# Patient Record
Sex: Male | Born: 1970 | Race: White | Hispanic: No | Smoking: Former smoker
Health system: Southern US, Community
[De-identification: ages and names within clinical notes are randomized; demographics above are authoritative.]

## PROBLEM LIST (undated history)

## (undated) DIAGNOSIS — I10 Essential (primary) hypertension: Secondary | ICD-10-CM

## (undated) DIAGNOSIS — Z87442 Personal history of urinary calculi: Secondary | ICD-10-CM

## (undated) DIAGNOSIS — I209 Angina pectoris, unspecified: Secondary | ICD-10-CM

## (undated) DIAGNOSIS — M1990 Unspecified osteoarthritis, unspecified site: Secondary | ICD-10-CM

---

## 1990-05-05 HISTORY — PX: BACK SURGERY: SHX140

## 2015-10-18 DIAGNOSIS — R000 Tachycardia, unspecified: Secondary | ICD-10-CM | POA: Diagnosis not present

## 2015-10-18 DIAGNOSIS — R42 Dizziness and giddiness: Secondary | ICD-10-CM | POA: Diagnosis not present

## 2015-10-18 DIAGNOSIS — R404 Transient alteration of awareness: Secondary | ICD-10-CM | POA: Diagnosis not present

## 2015-10-18 DIAGNOSIS — Z87891 Personal history of nicotine dependence: Secondary | ICD-10-CM | POA: Diagnosis not present

## 2015-10-18 DIAGNOSIS — R002 Palpitations: Secondary | ICD-10-CM | POA: Diagnosis not present

## 2015-11-08 DIAGNOSIS — A09 Infectious gastroenteritis and colitis, unspecified: Secondary | ICD-10-CM | POA: Diagnosis not present

## 2015-11-08 DIAGNOSIS — Z6827 Body mass index (BMI) 27.0-27.9, adult: Secondary | ICD-10-CM | POA: Diagnosis not present

## 2015-12-19 DIAGNOSIS — Z1389 Encounter for screening for other disorder: Secondary | ICD-10-CM | POA: Diagnosis not present

## 2015-12-19 DIAGNOSIS — Z6828 Body mass index (BMI) 28.0-28.9, adult: Secondary | ICD-10-CM | POA: Diagnosis not present

## 2015-12-19 DIAGNOSIS — R1012 Left upper quadrant pain: Secondary | ICD-10-CM | POA: Diagnosis not present

## 2015-12-28 DIAGNOSIS — R748 Abnormal levels of other serum enzymes: Secondary | ICD-10-CM | POA: Diagnosis not present

## 2015-12-28 DIAGNOSIS — N281 Cyst of kidney, acquired: Secondary | ICD-10-CM | POA: Diagnosis not present

## 2015-12-28 DIAGNOSIS — N289 Disorder of kidney and ureter, unspecified: Secondary | ICD-10-CM | POA: Diagnosis not present

## 2016-01-08 DIAGNOSIS — N281 Cyst of kidney, acquired: Secondary | ICD-10-CM | POA: Diagnosis not present

## 2016-01-08 DIAGNOSIS — R1012 Left upper quadrant pain: Secondary | ICD-10-CM | POA: Diagnosis not present

## 2016-01-08 DIAGNOSIS — N2889 Other specified disorders of kidney and ureter: Secondary | ICD-10-CM | POA: Diagnosis not present

## 2016-04-04 DIAGNOSIS — H04123 Dry eye syndrome of bilateral lacrimal glands: Secondary | ICD-10-CM | POA: Diagnosis not present

## 2016-04-04 DIAGNOSIS — G44219 Episodic tension-type headache, not intractable: Secondary | ICD-10-CM | POA: Diagnosis not present

## 2017-04-24 ENCOUNTER — Ambulatory Visit (INDEPENDENT_AMBULATORY_CARE_PROVIDER_SITE_OTHER): Payer: BLUE CROSS/BLUE SHIELD

## 2017-04-24 DIAGNOSIS — Z23 Encounter for immunization: Secondary | ICD-10-CM | POA: Diagnosis not present

## 2017-07-16 DIAGNOSIS — J209 Acute bronchitis, unspecified: Secondary | ICD-10-CM | POA: Diagnosis not present

## 2017-07-16 DIAGNOSIS — Z681 Body mass index (BMI) 19 or less, adult: Secondary | ICD-10-CM | POA: Diagnosis not present

## 2017-07-16 DIAGNOSIS — R030 Elevated blood-pressure reading, without diagnosis of hypertension: Secondary | ICD-10-CM | POA: Diagnosis not present

## 2017-07-16 DIAGNOSIS — J0101 Acute recurrent maxillary sinusitis: Secondary | ICD-10-CM | POA: Diagnosis not present

## 2017-07-20 DIAGNOSIS — J069 Acute upper respiratory infection, unspecified: Secondary | ICD-10-CM | POA: Diagnosis not present

## 2017-07-20 DIAGNOSIS — I10 Essential (primary) hypertension: Secondary | ICD-10-CM | POA: Diagnosis not present

## 2017-07-20 DIAGNOSIS — Z6829 Body mass index (BMI) 29.0-29.9, adult: Secondary | ICD-10-CM | POA: Diagnosis not present

## 2017-07-20 DIAGNOSIS — R05 Cough: Secondary | ICD-10-CM | POA: Diagnosis not present

## 2017-07-24 DIAGNOSIS — R002 Palpitations: Secondary | ICD-10-CM | POA: Diagnosis not present

## 2017-07-24 DIAGNOSIS — J069 Acute upper respiratory infection, unspecified: Secondary | ICD-10-CM | POA: Diagnosis not present

## 2017-07-24 DIAGNOSIS — I471 Supraventricular tachycardia: Secondary | ICD-10-CM | POA: Diagnosis not present

## 2017-07-24 DIAGNOSIS — Z87891 Personal history of nicotine dependence: Secondary | ICD-10-CM | POA: Diagnosis not present

## 2017-07-24 DIAGNOSIS — R000 Tachycardia, unspecified: Secondary | ICD-10-CM | POA: Diagnosis not present

## 2017-08-13 DIAGNOSIS — J209 Acute bronchitis, unspecified: Secondary | ICD-10-CM | POA: Diagnosis not present

## 2017-08-13 DIAGNOSIS — Z1331 Encounter for screening for depression: Secondary | ICD-10-CM | POA: Diagnosis not present

## 2017-08-13 DIAGNOSIS — R05 Cough: Secondary | ICD-10-CM | POA: Diagnosis not present

## 2017-08-13 DIAGNOSIS — Z1389 Encounter for screening for other disorder: Secondary | ICD-10-CM | POA: Diagnosis not present

## 2017-08-13 DIAGNOSIS — I10 Essential (primary) hypertension: Secondary | ICD-10-CM | POA: Diagnosis not present

## 2017-08-13 DIAGNOSIS — J069 Acute upper respiratory infection, unspecified: Secondary | ICD-10-CM | POA: Diagnosis not present

## 2017-08-21 DIAGNOSIS — R002 Palpitations: Secondary | ICD-10-CM | POA: Diagnosis not present

## 2017-08-21 DIAGNOSIS — Z79899 Other long term (current) drug therapy: Secondary | ICD-10-CM | POA: Diagnosis not present

## 2017-08-21 DIAGNOSIS — Z87891 Personal history of nicotine dependence: Secondary | ICD-10-CM | POA: Diagnosis not present

## 2017-09-02 DIAGNOSIS — Z6829 Body mass index (BMI) 29.0-29.9, adult: Secondary | ICD-10-CM | POA: Diagnosis not present

## 2017-09-02 DIAGNOSIS — I471 Supraventricular tachycardia: Secondary | ICD-10-CM | POA: Diagnosis not present

## 2017-09-02 DIAGNOSIS — R0602 Shortness of breath: Secondary | ICD-10-CM | POA: Diagnosis not present

## 2017-09-10 DIAGNOSIS — R748 Abnormal levels of other serum enzymes: Secondary | ICD-10-CM | POA: Diagnosis not present

## 2017-09-10 DIAGNOSIS — K824 Cholesterolosis of gallbladder: Secondary | ICD-10-CM | POA: Diagnosis not present

## 2017-09-10 DIAGNOSIS — E039 Hypothyroidism, unspecified: Secondary | ICD-10-CM | POA: Diagnosis not present

## 2017-09-14 DIAGNOSIS — E039 Hypothyroidism, unspecified: Secondary | ICD-10-CM | POA: Diagnosis not present

## 2017-09-14 DIAGNOSIS — R748 Abnormal levels of other serum enzymes: Secondary | ICD-10-CM | POA: Diagnosis not present

## 2017-09-15 DIAGNOSIS — E039 Hypothyroidism, unspecified: Secondary | ICD-10-CM | POA: Diagnosis not present

## 2018-02-25 DIAGNOSIS — M9903 Segmental and somatic dysfunction of lumbar region: Secondary | ICD-10-CM | POA: Diagnosis not present

## 2018-03-01 DIAGNOSIS — M9903 Segmental and somatic dysfunction of lumbar region: Secondary | ICD-10-CM | POA: Diagnosis not present

## 2018-03-03 DIAGNOSIS — M9903 Segmental and somatic dysfunction of lumbar region: Secondary | ICD-10-CM | POA: Diagnosis not present

## 2018-03-03 DIAGNOSIS — M5431 Sciatica, right side: Secondary | ICD-10-CM | POA: Diagnosis not present

## 2018-03-03 DIAGNOSIS — M9905 Segmental and somatic dysfunction of pelvic region: Secondary | ICD-10-CM | POA: Diagnosis not present

## 2018-03-03 DIAGNOSIS — M9904 Segmental and somatic dysfunction of sacral region: Secondary | ICD-10-CM | POA: Diagnosis not present

## 2018-03-04 DIAGNOSIS — M5431 Sciatica, right side: Secondary | ICD-10-CM | POA: Diagnosis not present

## 2018-03-04 DIAGNOSIS — M9904 Segmental and somatic dysfunction of sacral region: Secondary | ICD-10-CM | POA: Diagnosis not present

## 2018-03-04 DIAGNOSIS — M9903 Segmental and somatic dysfunction of lumbar region: Secondary | ICD-10-CM | POA: Diagnosis not present

## 2018-03-04 DIAGNOSIS — M9905 Segmental and somatic dysfunction of pelvic region: Secondary | ICD-10-CM | POA: Diagnosis not present

## 2018-03-10 DIAGNOSIS — M9905 Segmental and somatic dysfunction of pelvic region: Secondary | ICD-10-CM | POA: Diagnosis not present

## 2018-03-10 DIAGNOSIS — M5431 Sciatica, right side: Secondary | ICD-10-CM | POA: Diagnosis not present

## 2018-03-10 DIAGNOSIS — M9904 Segmental and somatic dysfunction of sacral region: Secondary | ICD-10-CM | POA: Diagnosis not present

## 2018-03-10 DIAGNOSIS — M9903 Segmental and somatic dysfunction of lumbar region: Secondary | ICD-10-CM | POA: Diagnosis not present

## 2018-03-11 DIAGNOSIS — M9904 Segmental and somatic dysfunction of sacral region: Secondary | ICD-10-CM | POA: Diagnosis not present

## 2018-03-11 DIAGNOSIS — M9903 Segmental and somatic dysfunction of lumbar region: Secondary | ICD-10-CM | POA: Diagnosis not present

## 2018-03-11 DIAGNOSIS — M9905 Segmental and somatic dysfunction of pelvic region: Secondary | ICD-10-CM | POA: Diagnosis not present

## 2018-03-11 DIAGNOSIS — M5431 Sciatica, right side: Secondary | ICD-10-CM | POA: Diagnosis not present

## 2018-03-16 DIAGNOSIS — M9905 Segmental and somatic dysfunction of pelvic region: Secondary | ICD-10-CM | POA: Diagnosis not present

## 2018-03-16 DIAGNOSIS — M5431 Sciatica, right side: Secondary | ICD-10-CM | POA: Diagnosis not present

## 2018-03-16 DIAGNOSIS — M9903 Segmental and somatic dysfunction of lumbar region: Secondary | ICD-10-CM | POA: Diagnosis not present

## 2018-03-16 DIAGNOSIS — M9904 Segmental and somatic dysfunction of sacral region: Secondary | ICD-10-CM | POA: Diagnosis not present

## 2018-04-27 DIAGNOSIS — M791 Myalgia, unspecified site: Secondary | ICD-10-CM | POA: Diagnosis not present

## 2018-04-27 DIAGNOSIS — M545 Low back pain: Secondary | ICD-10-CM | POA: Diagnosis not present

## 2018-04-27 DIAGNOSIS — I10 Essential (primary) hypertension: Secondary | ICD-10-CM | POA: Diagnosis not present

## 2018-04-27 DIAGNOSIS — Z6829 Body mass index (BMI) 29.0-29.9, adult: Secondary | ICD-10-CM | POA: Diagnosis not present

## 2018-05-19 DIAGNOSIS — M545 Low back pain: Secondary | ICD-10-CM | POA: Diagnosis not present

## 2018-05-19 DIAGNOSIS — I10 Essential (primary) hypertension: Secondary | ICD-10-CM | POA: Diagnosis not present

## 2018-05-19 DIAGNOSIS — Z6829 Body mass index (BMI) 29.0-29.9, adult: Secondary | ICD-10-CM | POA: Diagnosis not present

## 2018-05-25 DIAGNOSIS — H40033 Anatomical narrow angle, bilateral: Secondary | ICD-10-CM | POA: Diagnosis not present

## 2018-05-25 DIAGNOSIS — H1013 Acute atopic conjunctivitis, bilateral: Secondary | ICD-10-CM | POA: Diagnosis not present

## 2018-07-16 ENCOUNTER — Other Ambulatory Visit: Payer: Self-pay

## 2018-07-16 ENCOUNTER — Ambulatory Visit (INDEPENDENT_AMBULATORY_CARE_PROVIDER_SITE_OTHER): Payer: BLUE CROSS/BLUE SHIELD | Admitting: *Deleted

## 2018-07-16 DIAGNOSIS — Z23 Encounter for immunization: Secondary | ICD-10-CM

## 2018-10-13 DIAGNOSIS — Z5321 Procedure and treatment not carried out due to patient leaving prior to being seen by health care provider: Secondary | ICD-10-CM | POA: Diagnosis not present

## 2018-10-13 DIAGNOSIS — R002 Palpitations: Secondary | ICD-10-CM | POA: Diagnosis not present

## 2018-12-16 DIAGNOSIS — R002 Palpitations: Secondary | ICD-10-CM | POA: Diagnosis not present

## 2018-12-16 DIAGNOSIS — J309 Allergic rhinitis, unspecified: Secondary | ICD-10-CM | POA: Diagnosis not present

## 2018-12-16 DIAGNOSIS — I471 Supraventricular tachycardia: Secondary | ICD-10-CM | POA: Diagnosis not present

## 2018-12-16 DIAGNOSIS — I499 Cardiac arrhythmia, unspecified: Secondary | ICD-10-CM | POA: Diagnosis not present

## 2018-12-16 DIAGNOSIS — Z87891 Personal history of nicotine dependence: Secondary | ICD-10-CM | POA: Diagnosis not present

## 2018-12-29 DIAGNOSIS — Z87891 Personal history of nicotine dependence: Secondary | ICD-10-CM | POA: Diagnosis not present

## 2018-12-29 DIAGNOSIS — I700 Atherosclerosis of aorta: Secondary | ICD-10-CM | POA: Diagnosis not present

## 2018-12-29 DIAGNOSIS — N200 Calculus of kidney: Secondary | ICD-10-CM | POA: Diagnosis not present

## 2018-12-29 DIAGNOSIS — M545 Low back pain: Secondary | ICD-10-CM | POA: Diagnosis not present

## 2018-12-29 DIAGNOSIS — N201 Calculus of ureter: Secondary | ICD-10-CM | POA: Diagnosis not present

## 2018-12-29 DIAGNOSIS — R109 Unspecified abdominal pain: Secondary | ICD-10-CM | POA: Diagnosis not present

## 2018-12-29 DIAGNOSIS — N281 Cyst of kidney, acquired: Secondary | ICD-10-CM | POA: Diagnosis not present

## 2019-05-17 DIAGNOSIS — I471 Supraventricular tachycardia: Secondary | ICD-10-CM | POA: Diagnosis not present

## 2019-05-17 DIAGNOSIS — Z87891 Personal history of nicotine dependence: Secondary | ICD-10-CM | POA: Diagnosis not present

## 2019-05-17 DIAGNOSIS — R0789 Other chest pain: Secondary | ICD-10-CM | POA: Diagnosis not present

## 2019-05-23 DIAGNOSIS — M545 Low back pain: Secondary | ICD-10-CM | POA: Diagnosis not present

## 2019-05-23 DIAGNOSIS — Z6830 Body mass index (BMI) 30.0-30.9, adult: Secondary | ICD-10-CM | POA: Diagnosis not present

## 2019-05-23 DIAGNOSIS — I471 Supraventricular tachycardia: Secondary | ICD-10-CM | POA: Diagnosis not present

## 2019-05-23 DIAGNOSIS — I10 Essential (primary) hypertension: Secondary | ICD-10-CM | POA: Diagnosis not present

## 2019-05-27 DIAGNOSIS — M25551 Pain in right hip: Secondary | ICD-10-CM | POA: Diagnosis not present

## 2019-06-06 DIAGNOSIS — M25551 Pain in right hip: Secondary | ICD-10-CM | POA: Diagnosis not present

## 2019-06-09 DIAGNOSIS — I471 Supraventricular tachycardia: Secondary | ICD-10-CM | POA: Diagnosis not present

## 2019-06-09 DIAGNOSIS — I10 Essential (primary) hypertension: Secondary | ICD-10-CM | POA: Diagnosis not present

## 2019-06-09 DIAGNOSIS — Z13220 Encounter for screening for lipoid disorders: Secondary | ICD-10-CM | POA: Diagnosis not present

## 2019-06-09 DIAGNOSIS — Z6829 Body mass index (BMI) 29.0-29.9, adult: Secondary | ICD-10-CM | POA: Diagnosis not present

## 2019-06-17 DIAGNOSIS — I499 Cardiac arrhythmia, unspecified: Secondary | ICD-10-CM

## 2019-06-17 HISTORY — DX: Cardiac arrhythmia, unspecified: I49.9

## 2019-06-24 DIAGNOSIS — J019 Acute sinusitis, unspecified: Secondary | ICD-10-CM | POA: Diagnosis not present

## 2019-07-08 DIAGNOSIS — M25551 Pain in right hip: Secondary | ICD-10-CM | POA: Diagnosis not present

## 2019-07-14 DIAGNOSIS — M25552 Pain in left hip: Secondary | ICD-10-CM | POA: Diagnosis not present

## 2019-08-11 DIAGNOSIS — M25551 Pain in right hip: Secondary | ICD-10-CM | POA: Diagnosis not present

## 2019-08-19 DIAGNOSIS — I10 Essential (primary) hypertension: Secondary | ICD-10-CM | POA: Diagnosis not present

## 2019-08-19 DIAGNOSIS — I471 Supraventricular tachycardia: Secondary | ICD-10-CM | POA: Diagnosis not present

## 2019-08-30 DIAGNOSIS — I471 Supraventricular tachycardia: Secondary | ICD-10-CM | POA: Diagnosis not present

## 2019-08-30 DIAGNOSIS — Z6829 Body mass index (BMI) 29.0-29.9, adult: Secondary | ICD-10-CM | POA: Diagnosis not present

## 2019-08-30 DIAGNOSIS — Z01810 Encounter for preprocedural cardiovascular examination: Secondary | ICD-10-CM | POA: Diagnosis not present

## 2019-09-22 DIAGNOSIS — M25551 Pain in right hip: Secondary | ICD-10-CM | POA: Diagnosis not present

## 2019-10-05 ENCOUNTER — Ambulatory Visit: Payer: Self-pay | Admitting: Physician Assistant

## 2019-10-05 NOTE — Patient Instructions (Signed)
DUE TO COVID-19 ONLY ONE VISITOR IS ALLOWED TO COME WITH YOU AND STAY IN THE WAITING ROOM ONLY DURING PRE OP AND PROCEDURE DAY OF SURGERY. THE 2 VISITORS  MAY VISIT WITH YOU AFTER SURGERY IN YOUR PRIVATE ROOM DURING VISITING HOURS ONLY!  YOU NEED TO HAVE A COVID 19 TEST ON   6/8_______ @_2 :40______, THIS TEST MUST BE DONE BEFORE SURGERY, COME  [address], [place] , .  [facility]) ONCE YOUR COVID TEST IS COMPLETED, PLEASE BEGIN THE QUARANTINE INSTRUCTIONS AS OUTLINED IN YOUR HANDOUT.                KHARTER BREW    Your procedure is scheduled on: 10/14/19   Report to [facility] Main  Entrance   Report to Short Stay at 5:30 AM     Call this number if you have problems the morning of surgery [phone]    Remember: Do not eat food or drink liquids :After Midnight.   BRUSH YOUR TEETH MORNING OF SURGERY AND RINSE YOUR MOUTH OUT, NO CHEWING GUM CANDY OR MINTS.    Do not eat food After Midnight.   YOU MAY HAVE CLEAR LIQUIDS FROM MIDNIGHT UNTIL 4:30 AM  . At 4:30 AM Please finish the prescribed Pre-Surgery drink  . Nothing by mouth after you finish the  drink !   Take these medicines the morning of surgery with A SIP OF WATER: None                                 You may not have any metal on your body including               piercings  Do not wear jewelry,  lotions, powders or deodorant                         Men may shave face and neck.   Do not bring valuables to the hospital. [place] IS NOT             RESPONSIBLE   FOR VALUABLES.  Contacts, dentures or bridgework may not be worn into surgery.       Patients discharged the day of surgery will not be allowed to drive home.   IF YOU ARE HAVING SURGERY AND GOING HOME THE SAME DAY, YOU MUST HAVE AN ADULT TO DRIVE YOU HOME AND BE WITH YOU FOR 24 HOURS.  YOU MAY GO HOME BY TAXI OR UBER OR ORTHERWISE, BUT AN ADULT MUST ACCOMPANY YOU HOME AND STAY WITH YOU FOR 24 HOURS.  Name  and phone number of your driver:  Special Instructions: N/A              Please read over the following fact sheets you were given: _____________________________________________________________________             [facility] - Preparing for Surgery Before surgery, you can play an important role.   Because skin is not sterile, your skin needs to be as free of germs as possible.   You can reduce the number of germs on your skin by washing with CHG (chlorahexidine gluconate) soap before surgery .  CHG is an antiseptic cleaner which kills germs and bonds with the skin to continue killing germs even after washing. Please DO NOT use if you have an allergy to CHG or antibacterial soaps.  If your skin becomes reddened/irritated stop using the CHG and inform your nurse when you arrive at Short Stay.   You may shave your face/neck. Please follow these instructions carefully:  1.  Shower with CHG Soap the night before surgery and the  morning of Surgery.  2.  If you choose to wash your hair, wash your hair first as usual with your  normal  shampoo.  3.  After you shampoo, rinse your hair and body thoroughly to remove the  shampoo.                                        4.  Use CHG as you would any other liquid soap.  You can apply chg directly  to the skin and wash                       Gently with a scrungie or clean washcloth.  5.  Apply the CHG Soap to your body ONLY FROM THE NECK DOWN.   Do not use on face/ open                           Wound or open sores. Avoid contact with eyes, ears mouth and genitals (private parts).                       Wash face,  Genitals (private parts) with your normal soap.             6.  Wash thoroughly, paying special attention to the area where your surgery  will be performed.  7.  Thoroughly rinse your body with warm water from the neck down.  8.  DO NOT shower/wash with your normal soap after using and rinsing off  the CHG Soap.                9.  Pat  yourself dry with a clean towel.            10.  Wear clean pajamas.            11.  Place clean sheets on your bed the night of your first shower and do not  sleep with pets. Day of Surgery : Do not apply any lotions/deodorants the morning of surgery.  Please wear clean clothes to the hospital/surgery center.  FAILURE TO FOLLOW THESE INSTRUCTIONS MAY RESULT IN THE CANCELLATION OF YOUR SURGERY PATIENT SIGNATURE_________________________________  NURSE SIGNATURE__________________________________  ____  Incentive Spirometer  An incentive spirometer is a tool that can help keep your lungs clear and active. This tool measures how well you are filling your lungs with each breath. Taking long deep breaths may help reverse or decrease the chance of developing breathing (pulmonary) problems (especially infection) following:  A long period of time when you are unable to move or be active. BEFORE THE PROCEDURE   If the spirometer includes an indicator to show your best effort, your nurse or respiratory therapist will set it to a desired goal.  If possible, sit up straight or lean slightly forward. Try not to slouch.  Hold the incentive spirometer in an upright position. INSTRUCTIONS FOR USE  1. Sit on the edge of your bed if possible, or sit up as far as you can in bed or on a chair. 2. Hold the incentive  spirometer in an upright position. 3. Breathe out normally. 4. Place the mouthpiece in your mouth and seal your lips tightly around it. 5. Breathe in slowly and as deeply as possible, raising the piston or the ball toward the top of the column. 6. Hold your breath for 3-5 seconds or for as long as possible. Allow the piston or ball to fall to the bottom of the column. 7. Remove the mouthpiece from your mouth and breathe out normally. 8. Rest for a few seconds and repeat Steps 1 through 7 at least 10 times every 1-2 hours when you are awake. Take your time and take a few normal breaths between  deep breaths. 9. The spirometer may include an indicator to show your best effort. Use the indicator as a goal to work toward during each repetition. 10. After each set of 10 deep breaths, practice coughing to be sure your lungs are clear. If you have an incision (the cut made at the time of surgery), support your incision when coughing by placing a pillow or rolled up towels firmly against it. Once you are able to get out of bed, walk around indoors and cough well. You may stop using the incentive spirometer when instructed by your caregiver.  RISKS AND COMPLICATIONS  Take your time so you do not get dizzy or light-headed.  If you are in pain, you may need to take or ask for pain medication before doing incentive spirometry. It is harder to take a deep breath if you are having pain. AFTER USE  Rest and breathe slowly and easily.  It can be helpful to keep track of a log of your progress. Your caregiver can provide you with a simple table to help with this. If you are using the spirometer at home, follow these instructions: SEEK MEDICAL CARE IF:   You are having difficultly using the spirometer.  You have trouble using the spirometer as often as instructed.  Your pain medication is not giving enough relief while using the spirometer.  You develop fever of 100.5 F (38.1 C) or higher. SEEK IMMEDIATE MEDICAL CARE IF:   You cough up bloody sputum that had not been present before.  You develop fever of 102 F (38.9 C) or greater.  You develop worsening pain at or near the incision site. MAKE SURE YOU:   Understand these instructions.  Will watch your condition.  Will get help right away if you are not doing well or get worse. Document Released: 09/01/2006 Document Revised: 07/14/2011 Document Reviewed: 11/02/2006 [facility] Patient Information 2014 ExitCare,  [place].   ________________________________________________________________________ ____________________________________________________________________

## 2019-10-06 ENCOUNTER — Ambulatory Visit: Payer: Self-pay | Admitting: Physician Assistant

## 2019-10-06 ENCOUNTER — Encounter (HOSPITAL_COMMUNITY): Payer: Self-pay

## 2019-10-06 ENCOUNTER — Encounter (HOSPITAL_COMMUNITY)
Admission: RE | Admit: 2019-10-06 | Discharge: 2019-10-06 | Disposition: A | Discharge status: Home / Self Care | Payer: BC Managed Care – PPO | Source: Ambulatory Visit | Attending: Orthopedic Surgery | Admitting: Orthopedic Surgery

## 2019-10-06 ENCOUNTER — Other Ambulatory Visit: Payer: Self-pay

## 2019-10-06 DIAGNOSIS — Z01812 Encounter for preprocedural laboratory examination: Secondary | ICD-10-CM | POA: Insufficient documentation

## 2019-10-06 HISTORY — DX: Personal history of urinary calculi: Z87.442

## 2019-10-06 HISTORY — DX: Unspecified osteoarthritis, unspecified site: M19.90

## 2019-10-06 HISTORY — DX: Angina pectoris, unspecified: I20.9

## 2019-10-06 HISTORY — DX: Essential (primary) hypertension: I10

## 2019-10-06 LAB — COMPREHENSIVE METABOLIC PANEL
ALT: 17 U/L (ref 0–44)
AST: 17 U/L (ref 15–41)
Albumin: 4.3 g/dL (ref 3.5–5.0)
Alkaline Phosphatase: 102 U/L (ref 38–126)
Anion gap: 10 (ref 5–15)
BUN: 17 mg/dL (ref 6–20)
CO2: 29 mmol/L (ref 22–32)
Calcium: 9.8 mg/dL (ref 8.9–10.3)
Chloride: 104 mmol/L (ref 98–111)
Creatinine, Ser: 0.81 mg/dL (ref 0.61–1.24)
GFR calc Af Amer: >60 mL/min (ref >60)
GFR calc non Af Amer: >60 mL/min (ref >60)
Glucose, Bld: 101 mg/dL — ABNORMAL HIGH (ref 70–99)
Potassium: 4.1 mmol/L (ref 3.5–5.1)
Sodium: 143 mmol/L (ref 135–145)
Total Bilirubin: 0.9 mg/dL (ref 0.3–1.2)
Total Protein: 6.9 g/dL (ref 6.5–8.1)

## 2019-10-06 LAB — CBC WITH DIFFERENTIAL/PLATELET
Abs Immature Granulocytes: 0 10*3/uL (ref 0.00–0.07)
Basophils Absolute: 0 10*3/uL (ref 0.0–0.1)
Basophils Relative: 1 %
Eosinophils Absolute: 0.1 10*3/uL (ref 0.0–0.5)
Eosinophils Relative: 2 %
HCT: 48.9 % (ref 39.0–52.0)
Hemoglobin: 16.4 g/dL (ref 13.0–17.0)
Immature Granulocytes: 0 %
Lymphocytes Relative: 27 %
Lymphs Abs: 1.1 10*3/uL (ref 0.7–4.0)
MCH: 31.7 pg (ref 26.0–34.0)
MCHC: 33.5 g/dL (ref 30.0–36.0)
MCV: 94.4 fL (ref 80.0–100.0)
Monocytes Absolute: 0.4 10*3/uL (ref 0.1–1.0)
Monocytes Relative: 10 %
Neutro Abs: 2.4 10*3/uL (ref 1.7–7.7)
Neutrophils Relative %: 60 %
Platelets: 159 10*3/uL (ref 150–400)
RBC: 5.18 MIL/uL (ref 4.22–5.81)
RDW: 12 % (ref 11.5–15.5)
WBC: 4 10*3/uL (ref 4.0–10.5)
nRBC: 0 % (ref 0.0–0.2)

## 2019-10-06 LAB — PROTIME-INR
INR: 1 (ref 0.8–1.2)
Prothrombin Time: 13.2 seconds (ref 11.4–15.2)

## 2019-10-06 LAB — SURGICAL PCR SCREEN
MRSA, PCR: NEGATIVE
Staphylococcus aureus: NEGATIVE

## 2019-10-06 LAB — APTT: aPTT: 29 seconds (ref 24–36)

## 2019-10-06 LAB — ABO/RH: ABO/RH(D): A POS

## 2019-10-06 NOTE — H&P (Signed)
TOTAL HIP ADMISSION H&P ° °Patient is admitted for right total hip arthroplasty. ° °Subjective: ° °Chief Complaint: right hip pain ° °HPI: Javier Taylor, 49 y.o. male, has a history of pain and functional disability in the right hip(s) due to arthritis and patient has failed non-surgical conservative treatments for greater than 12 weeks to include NSAID's and/or analgesics, corticosteriod injections and activity modification.  Onset of symptoms was gradual starting 5 years ago with gradually worsening course since that time.The patient noted no past surgery on the right hip(s).  Patient currently rates pain in the right hip at 8 out of 10 with activity. Patient has night pain, worsening of pain with activity and weight bearing and pain that interfers with activities of daily living. Patient has evidence of periarticular osteophytes and joint space narrowing by imaging studies. This condition presents safety issues increasing the risk of falls.  There is no current active infection. ° °There are no problems to display for this patient. ° °Past Medical History:  °Diagnosis Date  °• Anginal pain (HCC)   °• Arthritis   °• Dysrhythmia 06/17/2019  ° SVT  °• History of kidney stones   °• Hypertension   °  °Past Surgical History:  °Procedure Laterality Date  °• BACK SURGERY  1992  ° L5  °  °Current Outpatient Medications  °Medication Sig Dispense Refill Last Dose  °• diltiazem (CARDIZEM CD) 240 MG 24 hr capsule Take 240 mg by mouth daily.     °• ibuprofen (ADVIL) 200 MG tablet Take 800 mg by mouth every 6 (six) hours as needed for headache or mild pain.     °• metoprolol tartrate (LOPRESSOR) 25 MG tablet Take 25 mg by mouth daily as needed. Heart racing or out of rhythm     ° °No current facility-administered medications for this visit.  ° °No Known Allergies  °Social History  ° °Tobacco Use  °• Smoking status: Former Smoker  °  Packs/day: 2.00  °  Years: 10.00  °  Pack years: 20.00  °  Types: Cigarettes  °  Quit date:  10/05/1992  °  Years since quitting: 27.0  °• Smokeless tobacco: Never Used  °Substance Use Topics  °• Alcohol use: Never  °  °No family history on file.  ° °Review of Systems  °Musculoskeletal: Positive for arthralgias.  °All other systems reviewed and are negative. ° ° °Objective: ° °Physical Exam  °Constitutional: He is oriented to person, place, and time. He appears well-developed and well-nourished. No distress.  °HENT:  °Head: Normocephalic and atraumatic.  °Eyes: Pupils are equal, round, and reactive to light. Conjunctivae and EOM are normal.  °Cardiovascular: Normal rate, regular rhythm, normal heart sounds and intact distal pulses.  °Respiratory: Effort normal and breath sounds normal. No respiratory distress. He has no wheezes.  °GI: Soft. Bowel sounds are normal. He exhibits no distension. There is no abdominal tenderness.  °Musculoskeletal:  °   Cervical back: Normal range of motion and neck supple.  °   Right hip: Tenderness and bony tenderness present. Decreased range of motion.  °Lymphadenopathy:  °  He has no cervical adenopathy.  °Neurological: He is alert and oriented to person, place, and time.  °Skin: Skin is warm and dry. No rash noted. No erythema.  °Psychiatric: He has a normal mood and affect. His behavior is normal.  ° ° °Vital signs in last 24 hours: °@VSRANGES@ ° °Labs: ° ° °Estimated body mass index is 28.7 kg/m² as calculated from the   following: °  Height as of an earlier encounter on 10/06/19: 6' 1" (1.854 m). °  Weight as of an earlier encounter on 10/06/19: 98.7 kg. ° ° °Imaging Review °Plain radiographs demonstrate severe degenerative joint disease of the right hip(s). The bone quality appears to be good for age and reported activity level. ° ° ° ° ° °Assessment/Plan: ° °End stage arthritis, right hip(s) ° °The patient history, physical examination, clinical judgement of the provider and imaging studies are consistent with end stage degenerative joint disease of the right hip(s) and total  hip arthroplasty is deemed medically necessary. The treatment options including medical management, injection therapy, arthroscopy and arthroplasty were discussed at length. The risks and benefits of total hip arthroplasty were presented and reviewed. The risks due to aseptic loosening, infection, stiffness, dislocation/subluxation,  thromboembolic complications and other imponderables were discussed.  The patient acknowledged the explanation, agreed to proceed with the plan and consent was signed. Patient is being admitted for inpatient treatment for surgery, pain control, PT, OT, prophylactic antibiotics, VTE prophylaxis, progressive ambulation and ADL's and discharge planning.The patient is planning to be discharged outpt PT ° ° °Anticipated LOS equal to or greater than 2 midnights due to °- Age 50 and older with one or more of the following: ° - Obesity ° - Expected need for hospital services (PT, OT, Nursing) required for safe  discharge ° - Anticipated need for postoperative skilled nursing care or inpatient rehab ° - Active co-morbidities: Cardiac Arrhythmia °OR  ° °- Unanticipated findings during/Post Surgery: None  °- Patient is a high risk of re-admission due to: None ° °

## 2019-10-06 NOTE — Patient Instructions (Addendum)
DUE TO COVID-19 ONLY ONE VISITOR IS ALLOWED TO COME WITH YOU AND STAY IN THE WAITING ROOM ONLY DURING PRE OP AND PROCEDURE DAY OF SURGERY. THE 2 VISITORS  MAY VISIT WITH YOU AFTER SURGERY IN YOUR PRIVATE ROOM DURING VISITING HOURS ONLY!  YOU NEED TO HAVE A COVID 19 TEST ON_6/8______ @_2 :40______, THIS TEST MUST BE DONE BEFORE SURGERY, COME  [address], [place] , .  [facility]) ONCE YOUR COVID TEST IS COMPLETED, PLEASE BEGIN THE QUARANTINE INSTRUCTIONS AS OUTLINED IN YOUR HANDOUT.                Javier Taylor   Your procedure is scheduled on: 10/14/19   Report to [facility] Main  Entrance   Report to Short Stay 5:30 AM     Call this number if you have problems the morning of surgery [phone]    BRUSH YOUR TEETH MORNING OF SURGERY AND RINSE YOUR MOUTH OUT, NO CHEWING GUM CANDY OR MINTS .  Do not eat food After Midnight  . YOU MAY HAVE CLEAR LIQUIDS FROM MIDNIGHT UNTIL 4:30 AM.   At 4:30 AM Please finish the prescribed Pre-Surgery Gatorade drink  . Nothing by mouth after you finish the Gatorade drink !   Take these medicines the morning of surgery with A SIP OF WATER: Cardiezem                                 You may not have any metal on your body including              piercings  Do not wear jewelry,  lotions, powders or deodorant                       Men may shave face and neck.   Do not bring valuables to the hospital. [place] IS NOT             RESPONSIBLE   FOR VALUABLES.  Contacts, dentures or bridgework may not be worn into surgery.      Patients discharged the day of surgery will not be allowed to drive home  . IF YOU ARE HAVING SURGERY AND GOING HOME THE SAME DAY, YOU MUST HAVE AN ADULT TO DRIVE YOU HOME AND BE WITH YOU FOR 24 HOURS  . YOU MAY GO HOME BY TAXI OR UBER OR ORTHERWISE, BUT AN ADULT MUST ACCOMPANY YOU HOME AND STAY WITH YOU FOR 24 HOURS.  Name and phone number of your driver:  Special  Instructions: N/A              Please read over the following fact sheets you were given: _____________________________________________________________________             [facility] - Preparing for Surgery Before surgery, you can play an important role.   Because skin is not sterile, your skin needs to be as free of germs as possible .  You can reduce the number of germs on your skin by washing with CHG (chlorahexidine gluconate) soap before surgery .  CHG is an antiseptic cleaner which kills germs and bonds with the skin to continue killing germs even after washing. Please DO NOT use if you have an allergy to CHG or antibacterial soaps.   If your skin becomes reddened/irritated stop using the CHG and inform your nurse when you arrive at Short Stay.  You may shave your face/neck. Please follow these instructions carefully:  1.  Shower with CHG Soap the night before surgery and the  morning of Surgery.  2.  If you choose to wash your hair, wash your hair first as usual with your  normal  shampoo.  3.  After you shampoo, rinse your hair and body thoroughly to remove the  shampoo.                                        4.  Use CHG as you would any other liquid soap.  You can apply chg directly  to the skin and wash                       Gently with a scrungie or clean washcloth.  5.  Apply the CHG Soap to your body ONLY FROM THE NECK DOWN.   Do not use on face/ open                           Wound or open sores. Avoid contact with eyes, ears mouth and genitals (private parts).                       Wash face,  Genitals (private parts) with your normal soap.             6.  Wash thoroughly, paying special attention to the area where your surgery  will be performed.  7.  Thoroughly rinse your body with warm water from the neck down.  8.  DO NOT shower/wash with your normal soap after using and rinsing off  the CHG Soap.             9.  Pat yourself dry with a clean towel.            10.   Wear clean pajamas.            11.  Place clean sheets on your bed the night of your first shower and do not  sleep with pets. Day of Surgery : Do not apply any lotions/deodorants the morning of surgery.  Please wear clean clothes to the hospital/surgery center.   Incentive Spirometer  An incentive spirometer is a tool that can help keep your lungs clear and active. This tool measures how well you are filling your lungs with each breath. Taking long deep breaths may help reverse or decrease the chance of developing breathing (pulmonary) problems (especially infection) following:  A long period of time when you are unable to move or be active. BEFORE THE PROCEDURE   If the spirometer includes an indicator to show your best effort, your nurse or respiratory therapist will set it to a desired goal.  If possible, sit up straight or lean slightly forward. Try not to slouch.  Hold the incentive spirometer in an upright position. INSTRUCTIONS FOR USE  1. Sit on the edge of your bed if possible, or sit up as far as you can in bed or on a chair. 2. Hold the incentive spirometer in an upright position. 3. Breathe out normally. 4. Place the mouthpiece in your mouth and seal your lips tightly around it. 5. Breathe in slowly and as deeply as possible, raising the piston or the ball toward the top of the column.  6. Hold your breath for 3-5 seconds or for as long as possible. Allow the piston or ball to fall to the bottom of the column. 7. Remove the mouthpiece from your mouth and breathe out normally. 8. Rest for a few seconds and repeat Steps 1 through 7 at least 10 times every 1-2 hours when you are awake. Take your time and take a few normal breaths between deep breaths. 9. The spirometer may include an indicator to show your best effort. Use the indicator as a goal to work toward during each repetition. 10. After each set of 10 deep breaths, practice coughing to be sure your lungs are clear. If you  have an incision (the cut made at the time of surgery), support your incision when coughing by placing a pillow or rolled up towels firmly against it. Once you are able to get out of bed, walk around indoors and cough well. You may stop using the incentive spirometer when instructed by your caregiver.  RISKS AND COMPLICATIONS  Take your time so you do not get dizzy or light-headed.  If you are in pain, you may need to take or ask for pain medication before doing incentive spirometry. It is harder to take a deep breath if you are having pain. AFTER USE  Rest and breathe slowly and easily.  It can be helpful to keep track of a log of your progress. Your caregiver can provide you with a simple table to help with this. If you are using the spirometer at home, follow these instructions: [place] IF:   You are having difficultly using the spirometer.  You have trouble using the spirometer as often as instructed.  Your pain medication is not giving enough relief while using the spirometer.  You develop fever of 100.5 F (38.1 C) or higher. SEEK IMMEDIATE MEDICAL CARE IF:   You cough up bloody sputum that had not been present before.  You develop fever of 102 F (38.9 C) or greater.  You develop worsening pain at or near the incision site. MAKE SURE YOU:   Understand these instructions.  Will watch your condition.  Will get help right away if you are not doing well or get worse. Document Released: 09/01/2006 Document Revised: 07/14/2011 Document Reviewed: 11/02/2006 [employer] Patient Information 2014 [place].   ________________________________________________________________________   FAILURE TO FOLLOW THESE INSTRUCTIONS MAY RESULT IN THE CANCELLATION OF YOUR SURGERY PATIENT SIGNATURE_________________________________  NURSE SIGNATURE__________________________________  ________________________________________________________________________

## 2019-10-06 NOTE — Progress Notes (Signed)
COVID Vaccine Completed:No- pt had Covid 1/21. Date COVID Vaccine completed: COVID vaccine manufacturer: Pfizer    [employer]   PCP - Dr. Donzetta Sprung Cardiologist - Dr. Rayetta Pigg Assar  Chest x-ray -no  EKG - 06/09/19 Stress Test - no ECHO - 08/19/19 Cardiac Cath - no  Sleep Study - no CPAP -   Fasting Blood Sugar - NA Checks Blood Sugar _____ times a day  Blood Thinner Instructions:NA Aspirin Instructions: Last Dose:  Anesthesia review:   Patient denies shortness of breath, fever, cough and chest pain at PAT appointment yes  Patient verbalized understanding of instructions that were given to them at the PAT appointment. Patient was also instructed that they will need to review over the PAT instructions again at home before surgery./ Yes   Pt takes Metoprolol PRN for SVT. Last episode was in April and he was seen and an ECHO was done

## 2019-10-06 NOTE — H&P (View-Only) (Signed)
TOTAL HIP ADMISSION H&P  Patient is admitted for right total hip arthroplasty.  Subjective:  Chief Complaint: right hip pain  HPI: Javier Taylor, 49 y.o. male, has a history of pain and functional disability in the right hip(s) due to arthritis and patient has failed non-surgical conservative treatments for greater than 12 weeks to include NSAID's and/or analgesics, corticosteriod injections and activity modification.  Onset of symptoms was gradual starting 5 years ago with gradually worsening course since that time.The patient noted no past surgery on the right hip(s).  Patient currently rates pain in the right hip at 8 out of 10 with activity. Patient has night pain, worsening of pain with activity and weight bearing and pain that interfers with activities of daily living. Patient has evidence of periarticular osteophytes and joint space narrowing by imaging studies. This condition presents safety issues increasing the risk of falls.  There is no current active infection.  There are no problems to display for this patient.  Past Medical History:  Diagnosis Date   Anginal pain (HCC)    Arthritis    Dysrhythmia 06/17/2019   SVT   History of kidney stones    Hypertension     Past Surgical History:  Procedure Laterality Date   BACK SURGERY  1992   L5    Current Outpatient Medications  Medication Sig Dispense Refill Last Dose   diltiazem (CARDIZEM CD) 240 MG 24 hr capsule Take 240 mg by mouth daily.      ibuprofen (ADVIL) 200 MG tablet Take 800 mg by mouth every 6 (six) hours as needed for headache or mild pain.      metoprolol tartrate (LOPRESSOR) 25 MG tablet Take 25 mg by mouth daily as needed. Heart racing or out of rhythm      No current facility-administered medications for this visit.   No Known Allergies  Social History   Tobacco Use   Smoking status: Former Smoker    Packs/day: 2.00    Years: 10.00    Pack years: 20.00    Types: Cigarettes    Quit date:  10/05/1992    Years since quitting: 27.0   Smokeless tobacco: Never Used  Substance Use Topics   Alcohol use: Never    No family history on file.   Review of Systems  Musculoskeletal: Positive for arthralgias.  All other systems reviewed and are negative.   Objective:  Physical Exam  Constitutional: He is oriented to person, place, and time. He appears well-developed and well-nourished. No distress.  HENT:  Head: Normocephalic and atraumatic.  Eyes: Pupils are equal, round, and reactive to light. Conjunctivae and EOM are normal.  Cardiovascular: Normal rate, regular rhythm, normal heart sounds and intact distal pulses.  Respiratory: Effort normal and breath sounds normal. No respiratory distress. He has no wheezes.  GI: Soft. Bowel sounds are normal. He exhibits no distension. There is no abdominal tenderness.  Musculoskeletal:     Cervical back: Normal range of motion and neck supple.     Right hip: Tenderness and bony tenderness present. Decreased range of motion.  Lymphadenopathy:    He has no cervical adenopathy.  Neurological: He is alert and oriented to person, place, and time.  Skin: Skin is warm and dry. No rash noted. No erythema.  Psychiatric: He has a normal mood and affect. His behavior is normal.    Vital signs in last 24 hours: @VSRANGES @  Labs:   Estimated body mass index is 28.7 kg/m as calculated from the  following:   Height as of an earlier encounter on 10/06/19: 6\' 1"  (1.854 m).   Weight as of an earlier encounter on 10/06/19: 98.7 kg.   Imaging Review Plain radiographs demonstrate severe degenerative joint disease of the right hip(s). The bone quality appears to be good for age and reported activity level.      Assessment/Plan:  End stage arthritis, right hip(s)  The patient history, physical examination, clinical judgement of the provider and imaging studies are consistent with end stage degenerative joint disease of the right hip(s) and total  hip arthroplasty is deemed medically necessary. The treatment options including medical management, injection therapy, arthroscopy and arthroplasty were discussed at length. The risks and benefits of total hip arthroplasty were presented and reviewed. The risks due to aseptic loosening, infection, stiffness, dislocation/subluxation,  thromboembolic complications and other imponderables were discussed.  The patient acknowledged the explanation, agreed to proceed with the plan and consent was signed. Patient is being admitted for inpatient treatment for surgery, pain control, PT, OT, prophylactic antibiotics, VTE prophylaxis, progressive ambulation and ADL's and discharge planning.The patient is planning to be discharged outpt PT   Anticipated LOS equal to or greater than 2 midnights due to - Age 79 and older with one or more of the following:  - Obesity  - Expected need for hospital services (PT, OT, Nursing) required for safe  discharge  - Anticipated need for postoperative skilled nursing care or inpatient rehab  - Active co-morbidities: Cardiac Arrhythmia OR   - Unanticipated findings during/Post Surgery: None  - Patient is a high risk of re-admission due to: None

## 2019-10-11 ENCOUNTER — Other Ambulatory Visit (HOSPITAL_COMMUNITY)
Admission: RE | Admit: 2019-10-11 | Discharge: 2019-10-11 | Disposition: A | Discharge status: Home / Self Care | Payer: BC Managed Care – PPO | Source: Ambulatory Visit | Attending: Orthopedic Surgery | Admitting: Orthopedic Surgery

## 2019-10-11 DIAGNOSIS — Z20822 Contact with and (suspected) exposure to covid-19: Secondary | ICD-10-CM | POA: Insufficient documentation

## 2019-10-11 DIAGNOSIS — Z01812 Encounter for preprocedural laboratory examination: Secondary | ICD-10-CM | POA: Insufficient documentation

## 2019-10-11 LAB — SARS CORONAVIRUS 2 (TAT 6-24 HRS): SARS Coronavirus 2: NEGATIVE

## 2019-10-11 NOTE — Progress Notes (Signed)
Anesthesia Chart Review   Case: [id number] Date/Time: 10/14/19 0715   Procedure: TOTAL HIP ARTHROPLASTY (Right Hip)   Anesthesia type: Choice   Pre-op diagnosis: OA RIGHT HIP   Location: [facility] RO[facility] 05 / [facility]   Surgeons: Frederico Hamman, MD      DISCUSSION:49 y.o. former smoker (20 pack years, quit 10/05/1992) with h/o HTN, SVT, OA right hip scheduled for above procedure 10/14/2019 with Dr. Frederico Hamman.   Last seen by cardiology for preoperative evaluation 08/30/2019.  Per OV note,  "Peroperative cardiovascular risk assessment -Able to perform 4 METS of activity without symptoms -Normal LV systolic function on echocardiogram -Overall risk is non prohibitive for orthopedic surgery and he would like to proceed.  -Cardizem should be continue peri-operatively without abrupt discontinuation.  -Continue PRN BB for sporadic tachycardia"  Anticipate pt can proceed with planned procedure barring acute status change.   VS: BP (!) 143/91   Pulse 76   Temp 36.7 C (Oral)   Resp 18   Ht 6\' 1"  (1.854 m)   Wt 98.7 kg   SpO2 99%   BMI 28.70 kg/m   PROVIDERS: , MD is PCP   Assar, Richardean Chimera, DO is Cardiologist  LABS: Labs reviewed: Acceptable for surgery. (all labs ordered are listed, but only abnormal results are displayed)  Labs Reviewed  COMPREHENSIVE METABOLIC PANEL - Abnormal; Notable for the following components:      Result Value   Glucose, Bld 101 (*)    All other components within normal limits  SURGICAL PCR SCREEN  APTT  CBC WITH DIFFERENTIAL/PLATELET  PROTIME-INR  TYPE AND SCREEN  ABO/RH     IMAGES:   EKG:   CV: Echo 08/19/2019 Summary  1. The left ventricle is normal in size with normal wall thickness.  2. The left ventricular systolic function is normal, LVEF is visually estimated at > 55%.  3. The right ventricle is upper normal in size, with normal systolic function.  4. The right atrium is mildly dilated in size.  5. Borderline dilated  aortic root (3.8 cm). Ascending aorta appears normal size where seen. Past Medical History:  Diagnosis Date  . Anginal pain (HCC)   . Arthritis   . Dysrhythmia 06/17/2019   SVT  . History of kidney stones   . Hypertension     Past Surgical History:  Procedure Laterality Date  . BACK SURGERY  1992   L5    MEDICATIONS: . diltiazem (CARDIZEM CD) 240 MG 24 hr capsule  . ibuprofen (ADVIL) 200 MG tablet  . metoprolol tartrate (LOPRESSOR) 25 MG tablet   No current facility-administered medications for this encounter.

## 2019-10-11 NOTE — Anesthesia Preprocedure Evaluation (Addendum)
Anesthesia Evaluation  Patient identified by MRN, date of birth, ID band Patient awake    Reviewed: Allergy & Precautions, NPO status , Patient's Chart, lab work & pertinent test results  Airway Mallampati: II  TM Distance: >3 FB Neck ROM: Full    Dental no notable dental hx.    Pulmonary neg pulmonary ROS, former smoker,    Pulmonary exam normal breath sounds clear to auscultation       Cardiovascular hypertension, Normal cardiovascular exam Rhythm:Regular Rate:Normal     Neuro/Psych negative neurological ROS  negative psych ROS   GI/Hepatic negative GI ROS, Neg liver ROS,   Endo/Other  negative endocrine ROS  Renal/GU negative Renal ROS  negative genitourinary   Musculoskeletal negative musculoskeletal ROS (+)   Abdominal   Peds negative pediatric ROS (+)  Hematology negative hematology ROS (+)   Anesthesia Other Findings   Reproductive/Obstetrics negative OB ROS                            Anesthesia Physical Anesthesia Plan  ASA: II  Anesthesia Plan: Spinal   Post-op Pain Management:    Induction: Intravenous  PONV Risk Score and Plan: 2 and Ondansetron, Dexamethasone and Treatment may vary due to age or medical condition  Airway Management Planned: Simple Face Mask  Additional Equipment:   Intra-op Plan:   Post-operative Plan:   Informed Consent: I have reviewed the patients History and Physical, chart, labs and discussed the procedure including the risks, benefits and alternatives for the proposed anesthesia with the patient or authorized representative who has indicated his/her understanding and acceptance.     Dental advisory given  Plan Discussed with: CRNA and Surgeon  Anesthesia Plan Comments: (Last seen by cardiology for preoperative evaluation 08/30/2019.  Per OV note,  "Peroperative cardiovascular risk assessment -Able to perform 4 METS of activity without  symptoms -Normal LV systolic function on echocardiogram -Overall risk is non prohibitive for orthopedic surgery and he would like to proceed.  -Cardizem should be continue peri-operatively without abrupt discontinuation.  -Continue PRN BB for sporadic tachycardia"  Echo 08/19/2019 Summary  1. The left ventricle is normal in size with normal wall thickness.  2. The left ventricular systolic function is normal, LVEF is visually estimated at > 55%.  3. The right ventricle is upper normal in size, with normal systolic function.  4. The right atrium is mildly dilated in size.  5. Borderline dilated aortic root (3.8 cm). Ascending aorta appears normal size where seen.)       Anesthesia Quick Evaluation

## 2019-10-13 ENCOUNTER — Encounter (HOSPITAL_COMMUNITY): Payer: Self-pay | Admitting: Orthopedic Surgery

## 2019-10-13 MED ORDER — BUPIVACAINE LIPOSOME 1.3 % IJ SUSP
10.0000 mL | Freq: Once | INTRAMUSCULAR | Status: DC
  Filled 2019-10-13: qty 10

## 2019-10-14 ENCOUNTER — Ambulatory Visit (HOSPITAL_COMMUNITY): Payer: BC Managed Care – PPO | Admitting: Physician Assistant

## 2019-10-14 ENCOUNTER — Ambulatory Visit (HOSPITAL_COMMUNITY)
Admission: RE | Admit: 2019-10-14 | Discharge: 2019-10-15 | Disposition: A | Discharge status: Home / Self Care | Payer: BC Managed Care – PPO | Source: Ambulatory Visit | Attending: Orthopedic Surgery | Admitting: Orthopedic Surgery

## 2019-10-14 ENCOUNTER — Ambulatory Visit (HOSPITAL_COMMUNITY): Payer: BC Managed Care – PPO | Admitting: Anesthesiology

## 2019-10-14 ENCOUNTER — Ambulatory Visit (HOSPITAL_COMMUNITY): Payer: BC Managed Care – PPO

## 2019-10-14 ENCOUNTER — Encounter (HOSPITAL_COMMUNITY)
Admission: RE | Disposition: A | Discharge status: Home / Self Care | Payer: Self-pay | Source: Ambulatory Visit | Attending: Orthopedic Surgery

## 2019-10-14 ENCOUNTER — Encounter (HOSPITAL_COMMUNITY): Payer: Self-pay | Admitting: Orthopedic Surgery

## 2019-10-14 ENCOUNTER — Other Ambulatory Visit: Payer: Self-pay

## 2019-10-14 DIAGNOSIS — Z79899 Other long term (current) drug therapy: Secondary | ICD-10-CM | POA: Diagnosis not present

## 2019-10-14 DIAGNOSIS — M1611 Unilateral primary osteoarthritis, right hip: Secondary | ICD-10-CM | POA: Diagnosis not present

## 2019-10-14 DIAGNOSIS — Z87442 Personal history of urinary calculi: Secondary | ICD-10-CM | POA: Insufficient documentation

## 2019-10-14 DIAGNOSIS — Z791 Long term (current) use of non-steroidal anti-inflammatories (NSAID): Secondary | ICD-10-CM | POA: Diagnosis not present

## 2019-10-14 DIAGNOSIS — I10 Essential (primary) hypertension: Secondary | ICD-10-CM | POA: Insufficient documentation

## 2019-10-14 DIAGNOSIS — Z471 Aftercare following joint replacement surgery: Secondary | ICD-10-CM | POA: Diagnosis not present

## 2019-10-14 DIAGNOSIS — I471 Supraventricular tachycardia: Secondary | ICD-10-CM | POA: Insufficient documentation

## 2019-10-14 DIAGNOSIS — Z87891 Personal history of nicotine dependence: Secondary | ICD-10-CM | POA: Insufficient documentation

## 2019-10-14 DIAGNOSIS — Z96641 Presence of right artificial hip joint: Secondary | ICD-10-CM | POA: Diagnosis not present

## 2019-10-14 DIAGNOSIS — Z419 Encounter for procedure for purposes other than remedying health state, unspecified: Secondary | ICD-10-CM

## 2019-10-14 HISTORY — PX: TOTAL HIP ARTHROPLASTY: SHX124

## 2019-10-14 LAB — PREPARE RBC (CROSSMATCH)

## 2019-10-14 SURGERY — ARTHROPLASTY, HIP, TOTAL,POSTERIOR APPROACH
Anesthesia: Spinal | Site: Hip | Laterality: Right

## 2019-10-14 MED ORDER — ONDANSETRON HCL 4 MG/2ML IJ SOLN: 4.0000 mg | Freq: Four times a day (QID) | INTRAMUSCULAR | Status: DC | PRN

## 2019-10-14 MED ORDER — ONDANSETRON HCL 4 MG/2ML IJ SOLN
INTRAMUSCULAR | Status: DC | PRN
  Administered 2019-10-14: 4 mg via INTRAVENOUS

## 2019-10-14 MED ORDER — CEFAZOLIN SODIUM-DEXTROSE 1-4 GM/50ML-% IV SOLN
1.0000 g | Freq: Four times a day (QID) | INTRAVENOUS | Status: AC
  Administered 2019-10-14 (×2): 1 g via INTRAVENOUS
  Filled 2019-10-14 (×2): qty 50

## 2019-10-14 MED ORDER — METHOCARBAMOL 500 MG IVPB - SIMPLE MED
500.0000 mg | Freq: Four times a day (QID) | INTRAVENOUS | Status: DC | PRN
  Administered 2019-10-14: 500 mg via INTRAVENOUS
  Filled 2019-10-14: qty 50

## 2019-10-14 MED ORDER — OXYCODONE HCL 5 MG PO TABS: ORAL_TABLET | ORAL | 0 refills | Status: DC

## 2019-10-14 MED ORDER — ESMOLOL HCL 100 MG/10ML IV SOLN
INTRAVENOUS | Status: AC
  Filled 2019-10-14: qty 10

## 2019-10-14 MED ORDER — SORBITOL 70 % SOLN
30.0000 mL | Freq: Every day | Status: DC | PRN
  Filled 2019-10-14: qty 30

## 2019-10-14 MED ORDER — ROCURONIUM BROMIDE 10 MG/ML (PF) SYRINGE
PREFILLED_SYRINGE | INTRAVENOUS | Status: AC
  Filled 2019-10-14: qty 10

## 2019-10-14 MED ORDER — METHOCARBAMOL 500 MG PO TABS
500.0000 mg | ORAL_TABLET | Freq: Four times a day (QID) | ORAL | Status: DC | PRN
  Administered 2019-10-14 – 2019-10-15 (×2): 500 mg via ORAL
  Filled 2019-10-14 (×2): qty 1

## 2019-10-14 MED ORDER — LIDOCAINE 2% (20 MG/ML) 5 ML SYRINGE
INTRAMUSCULAR | Status: DC | PRN
  Administered 2019-10-14: 40 mg via INTRAVENOUS

## 2019-10-14 MED ORDER — ALBUMIN HUMAN 5 % IV SOLN
INTRAVENOUS | Status: DC | PRN
Start: 2019-10-14 — End: 2019-10-14

## 2019-10-14 MED ORDER — METOPROLOL TARTRATE 25 MG PO TABS: 25.0000 mg | ORAL_TABLET | Freq: Every day | ORAL | Status: DC | PRN

## 2019-10-14 MED ORDER — TRANEXAMIC ACID 1000 MG/10ML IV SOLN
2000.0000 mg | Freq: Once | INTRAVENOUS | Status: AC
  Administered 2019-10-14: 2000 mg via TOPICAL
  Filled 2019-10-14: qty 20

## 2019-10-14 MED ORDER — GABAPENTIN 300 MG PO CAPS
300.0000 mg | ORAL_CAPSULE | Freq: Three times a day (TID) | ORAL | Status: DC
  Administered 2019-10-14 – 2019-10-15 (×3): 300 mg via ORAL
  Filled 2019-10-14 (×3): qty 1

## 2019-10-14 MED ORDER — ACETAMINOPHEN 325 MG PO TABS: 650.0000 mg | ORAL_TABLET | ORAL | 2 refills | Status: AC | PRN

## 2019-10-14 MED ORDER — SODIUM CHLORIDE 0.9 % IR SOLN
Status: DC | PRN
  Administered 2019-10-14: 1000 mL

## 2019-10-14 MED ORDER — TRANEXAMIC ACID-NACL 1000-0.7 MG/100ML-% IV SOLN
1000.0000 mg | Freq: Once | INTRAVENOUS | Status: AC
  Administered 2019-10-14: 1000 mg via INTRAVENOUS
  Filled 2019-10-14: qty 100

## 2019-10-14 MED ORDER — HYDROMORPHONE HCL 1 MG/ML IJ SOLN
INTRAMUSCULAR | Status: AC
  Filled 2019-10-14: qty 1

## 2019-10-14 MED ORDER — GABAPENTIN 300 MG PO CAPS: ORAL_CAPSULE | ORAL | 0 refills | Status: DC

## 2019-10-14 MED ORDER — CEFAZOLIN SODIUM-DEXTROSE 2-4 GM/100ML-% IV SOLN
2.0000 g | INTRAVENOUS | Status: AC
  Administered 2019-10-14: 2 g via INTRAVENOUS
  Filled 2019-10-14: qty 100

## 2019-10-14 MED ORDER — ASPIRIN 81 MG PO CHEW
81.0000 mg | CHEWABLE_TABLET | Freq: Two times a day (BID) | ORAL | Status: DC
  Administered 2019-10-14 – 2019-10-15 (×2): 81 mg via ORAL
  Filled 2019-10-14 (×2): qty 1

## 2019-10-14 MED ORDER — DIPHENHYDRAMINE HCL 12.5 MG/5ML PO ELIX
12.5000 mg | ORAL_SOLUTION | ORAL | Status: DC | PRN
  Administered 2019-10-14: 25 mg via ORAL
  Filled 2019-10-14: qty 10

## 2019-10-14 MED ORDER — SODIUM CHLORIDE 0.9 % IV SOLN: INTRAVENOUS | Status: DC

## 2019-10-14 MED ORDER — METOCLOPRAMIDE HCL 5 MG/ML IJ SOLN: 5.0000 mg | Freq: Three times a day (TID) | INTRAMUSCULAR | Status: DC | PRN

## 2019-10-14 MED ORDER — FENTANYL CITRATE (PF) 100 MCG/2ML IJ SOLN
INTRAMUSCULAR | Status: AC
  Filled 2019-10-14: qty 2

## 2019-10-14 MED ORDER — DILTIAZEM HCL ER COATED BEADS 240 MG PO CP24
240.0000 mg | ORAL_CAPSULE | Freq: Every day | ORAL | Status: DC
  Administered 2019-10-14: 240 mg via ORAL
  Filled 2019-10-14: qty 1

## 2019-10-14 MED ORDER — PHENYLEPHRINE 40 MCG/ML (10ML) SYRINGE FOR IV PUSH (FOR BLOOD PRESSURE SUPPORT)
PREFILLED_SYRINGE | INTRAVENOUS | Status: AC
  Filled 2019-10-14: qty 10

## 2019-10-14 MED ORDER — PHENYLEPHRINE HCL-NACL 10-0.9 MG/250ML-% IV SOLN
INTRAVENOUS | Status: DC | PRN
Start: 2019-10-14 — End: 2019-10-14
  Administered 2019-10-14: 55 ug/min via INTRAVENOUS

## 2019-10-14 MED ORDER — METOCLOPRAMIDE HCL 5 MG PO TABS: 5.0000 mg | ORAL_TABLET | Freq: Three times a day (TID) | ORAL | Status: DC | PRN

## 2019-10-14 MED ORDER — DOCUSATE SODIUM 100 MG PO CAPS
100.0000 mg | ORAL_CAPSULE | Freq: Two times a day (BID) | ORAL | Status: DC
  Administered 2019-10-14 – 2019-10-15 (×2): 100 mg via ORAL
  Filled 2019-10-14 (×2): qty 1

## 2019-10-14 MED ORDER — BUPIVACAINE HCL (PF) 0.25 % IJ SOLN
INTRAMUSCULAR | Status: AC
  Filled 2019-10-14: qty 30

## 2019-10-14 MED ORDER — HYDROMORPHONE HCL 1 MG/ML IJ SOLN
0.2500 mg | INTRAMUSCULAR | Status: DC | PRN
  Administered 2019-10-14 (×3): 0.5 mg via INTRAVENOUS

## 2019-10-14 MED ORDER — POVIDONE-IODINE 10 % EX SWAB
2.0000 "application " | Freq: Once | CUTANEOUS | Status: AC
  Administered 2019-10-14: 2 via TOPICAL

## 2019-10-14 MED ORDER — METHOCARBAMOL 500 MG PO TABS: 500.0000 mg | ORAL_TABLET | Freq: Four times a day (QID) | ORAL | 0 refills | Status: DC | PRN

## 2019-10-14 MED ORDER — LABETALOL HCL 5 MG/ML IV SOLN
INTRAVENOUS | Status: AC
  Filled 2019-10-14: qty 4

## 2019-10-14 MED ORDER — BUPIVACAINE IN DEXTROSE 0.75-8.25 % IT SOLN
INTRATHECAL | Status: DC | PRN
Start: 2019-10-14 — End: 2019-10-14
  Administered 2019-10-14: 2 mL via INTRATHECAL

## 2019-10-14 MED ORDER — MIDAZOLAM HCL 5 MG/5ML IJ SOLN
INTRAMUSCULAR | Status: DC | PRN
  Administered 2019-10-14: 2 mg via INTRAVENOUS

## 2019-10-14 MED ORDER — PROPOFOL 10 MG/ML IV BOLUS
INTRAVENOUS | Status: AC
  Filled 2019-10-14: qty 20

## 2019-10-14 MED ORDER — WATER FOR IRRIGATION, STERILE IR SOLN
Status: DC | PRN
  Administered 2019-10-14: 2000 mL

## 2019-10-14 MED ORDER — DOCUSATE SODIUM 100 MG PO CAPS: 100.0000 mg | ORAL_CAPSULE | Freq: Every day | ORAL | 2 refills | Status: AC | PRN

## 2019-10-14 MED ORDER — ESMOLOL HCL 100 MG/10ML IV SOLN
INTRAVENOUS | Status: DC | PRN
  Administered 2019-10-14: 10 mg via INTRAVENOUS

## 2019-10-14 MED ORDER — SUCCINYLCHOLINE CHLORIDE 200 MG/10ML IV SOSY
PREFILLED_SYRINGE | INTRAVENOUS | Status: AC
  Filled 2019-10-14: qty 10

## 2019-10-14 MED ORDER — OXYCODONE HCL 5 MG PO TABS
5.0000 mg | ORAL_TABLET | ORAL | Status: DC | PRN
  Administered 2019-10-14 – 2019-10-15 (×5): 10 mg via ORAL
  Filled 2019-10-14 (×5): qty 2

## 2019-10-14 MED ORDER — ASPIRIN EC 81 MG PO TBEC
81.0000 mg | DELAYED_RELEASE_TABLET | Freq: Two times a day (BID) | ORAL | 0 refills | Status: AC
Start: 2019-10-14 — End: 2019-11-13

## 2019-10-14 MED ORDER — PROPOFOL 10 MG/ML IV BOLUS
INTRAVENOUS | Status: AC
  Filled 2019-10-14: qty 40

## 2019-10-14 MED ORDER — ORAL CARE MOUTH RINSE: 15.0000 mL | Freq: Once | OROMUCOSAL | Status: AC

## 2019-10-14 MED ORDER — LIDOCAINE 2% (20 MG/ML) 5 ML SYRINGE
INTRAMUSCULAR | Status: AC
  Filled 2019-10-14: qty 10

## 2019-10-14 MED ORDER — CHLORHEXIDINE GLUCONATE 0.12 % MT SOLN
15.0000 mL | Freq: Once | OROMUCOSAL | Status: AC
  Administered 2019-10-14: 15 mL via OROMUCOSAL

## 2019-10-14 MED ORDER — ACETAMINOPHEN 500 MG PO TABS
1000.0000 mg | ORAL_TABLET | Freq: Four times a day (QID) | ORAL | Status: AC
  Administered 2019-10-14 – 2019-10-15 (×4): 1000 mg via ORAL
  Filled 2019-10-14 (×4): qty 2

## 2019-10-14 MED ORDER — OXYCODONE HCL 5 MG/5ML PO SOLN: 5.0000 mg | Freq: Once | ORAL | Status: DC | PRN

## 2019-10-14 MED ORDER — SODIUM CHLORIDE 0.9% IV SOLUTION: Freq: Once | INTRAVENOUS | Status: DC

## 2019-10-14 MED ORDER — PROPOFOL 500 MG/50ML IV EMUL
INTRAVENOUS | Status: DC | PRN
  Administered 2019-10-14: 100 ug/kg/min via INTRAVENOUS

## 2019-10-14 MED ORDER — SODIUM CHLORIDE (PF) 0.9 % IJ SOLN
INTRAMUSCULAR | Status: DC | PRN
  Administered 2019-10-14: 20 mL

## 2019-10-14 MED ORDER — ALBUMIN HUMAN 5 % IV SOLN
INTRAVENOUS | Status: AC
  Filled 2019-10-14: qty 250

## 2019-10-14 MED ORDER — PHENYLEPHRINE 40 MCG/ML (10ML) SYRINGE FOR IV PUSH (FOR BLOOD PRESSURE SUPPORT)
PREFILLED_SYRINGE | INTRAVENOUS | Status: DC | PRN
  Administered 2019-10-14 (×2): 40 ug via INTRAVENOUS
  Administered 2019-10-14 (×2): 80 ug via INTRAVENOUS

## 2019-10-14 MED ORDER — SODIUM CHLORIDE (PF) 0.9 % IJ SOLN
INTRAMUSCULAR | Status: AC
  Filled 2019-10-14: qty 20

## 2019-10-14 MED ORDER — BUPIVACAINE-EPINEPHRINE 0.25% -1:200000 IJ SOLN
INTRAMUSCULAR | Status: DC | PRN
  Administered 2019-10-14: 20 mL

## 2019-10-14 MED ORDER — MIDAZOLAM HCL 2 MG/2ML IJ SOLN
INTRAMUSCULAR | Status: AC
  Filled 2019-10-14: qty 2

## 2019-10-14 MED ORDER — PROPOFOL 10 MG/ML IV BOLUS
INTRAVENOUS | Status: DC | PRN
  Administered 2019-10-14: 20 mg via INTRAVENOUS

## 2019-10-14 MED ORDER — BUPIVACAINE LIPOSOME 1.3 % IJ SUSP
INTRAMUSCULAR | Status: DC | PRN
  Administered 2019-10-14: 10 mL

## 2019-10-14 MED ORDER — PHENOL 1.4 % MT LIQD: 1.0000 | OROMUCOSAL | Status: DC | PRN

## 2019-10-14 MED ORDER — OXYCODONE HCL 5 MG PO TABS: 5.0000 mg | ORAL_TABLET | Freq: Once | ORAL | Status: DC | PRN

## 2019-10-14 MED ORDER — PHENYLEPHRINE HCL-NACL 10-0.9 MG/250ML-% IV SOLN
INTRAVENOUS | Status: AC
  Filled 2019-10-14: qty 750

## 2019-10-14 MED ORDER — METHOCARBAMOL 500 MG IVPB - SIMPLE MED
INTRAVENOUS | Status: AC
  Filled 2019-10-14: qty 50

## 2019-10-14 MED ORDER — ADENOSINE 6 MG/2ML IV SOLN
INTRAVENOUS | Status: AC
  Filled 2019-10-14: qty 6

## 2019-10-14 MED ORDER — PROPOFOL 1000 MG/100ML IV EMUL
INTRAVENOUS | Status: AC
  Filled 2019-10-14: qty 200

## 2019-10-14 MED ORDER — ACETAMINOPHEN 10 MG/ML IV SOLN: 1000.0000 mg | Freq: Once | INTRAVENOUS | Status: DC | PRN

## 2019-10-14 MED ORDER — HYDROMORPHONE HCL 1 MG/ML IJ SOLN: 0.5000 mg | INTRAMUSCULAR | Status: DC | PRN

## 2019-10-14 MED ORDER — TRANEXAMIC ACID-NACL 1000-0.7 MG/100ML-% IV SOLN
1000.0000 mg | INTRAVENOUS | Status: AC
  Administered 2019-10-14 (×2): 1000 mg via INTRAVENOUS
  Filled 2019-10-14: qty 100

## 2019-10-14 MED ORDER — FLEET ENEMA 7-19 GM/118ML RE ENEM: 1.0000 | ENEMA | Freq: Once | RECTAL | Status: DC | PRN

## 2019-10-14 MED ORDER — ALUM & MAG HYDROXIDE-SIMETH 200-200-20 MG/5ML PO SUSP: 30.0000 mL | ORAL | Status: DC | PRN

## 2019-10-14 MED ORDER — DEXAMETHASONE SODIUM PHOSPHATE 10 MG/ML IJ SOLN
INTRAMUSCULAR | Status: DC | PRN
  Administered 2019-10-14: 10 mg via INTRAVENOUS

## 2019-10-14 MED ORDER — SENNOSIDES-DOCUSATE SODIUM 8.6-50 MG PO TABS: 1.0000 | ORAL_TABLET | Freq: Every evening | ORAL | Status: DC | PRN

## 2019-10-14 MED ORDER — ONDANSETRON HCL 4 MG PO TABS: 4.0000 mg | ORAL_TABLET | Freq: Four times a day (QID) | ORAL | Status: DC | PRN

## 2019-10-14 MED ORDER — TRANEXAMIC ACID-NACL 1000-0.7 MG/100ML-% IV SOLN
INTRAVENOUS | Status: AC
  Filled 2019-10-14: qty 100

## 2019-10-14 MED ORDER — MENTHOL 3 MG MT LOZG: 1.0000 | LOZENGE | OROMUCOSAL | Status: DC | PRN

## 2019-10-14 MED ORDER — FENTANYL CITRATE (PF) 100 MCG/2ML IJ SOLN
INTRAMUSCULAR | Status: DC | PRN
  Administered 2019-10-14: 50 ug via INTRAVENOUS
  Administered 2019-10-14 (×4): 25 ug via INTRAVENOUS
  Administered 2019-10-14: 50 ug via INTRAVENOUS

## 2019-10-14 MED ORDER — LACTATED RINGERS IV SOLN: INTRAVENOUS | Status: DC

## 2019-10-14 MED ORDER — ACETAMINOPHEN 325 MG PO TABS: 325.0000 mg | ORAL_TABLET | Freq: Four times a day (QID) | ORAL | Status: DC | PRN

## 2019-10-14 SURGICAL SUPPLY — 61 items
APL SKNCLS STERI-STRIP NONHPOA (GAUZE/BANDAGES/DRESSINGS) ×1
BAG DECANTER FOR FLEXI CONT (MISCELLANEOUS) ×2 IMPLANT
BAG SPEC THK2 15X12 ZIP CLS (MISCELLANEOUS) ×1
BAG ZIPLOCK 12X15 (MISCELLANEOUS) ×2 IMPLANT
BENZOIN TINCTURE PRP APPL 2/3 (GAUZE/BANDAGES/DRESSINGS) ×2 IMPLANT
BLADE SAW SAG 73X25 THK (BLADE) ×1
BLADE SAW SGTL 73X25 THK (BLADE) ×1 IMPLANT
BNDG COHESIVE 6X5 TAN STRL LF (GAUZE/BANDAGES/DRESSINGS) ×1 IMPLANT
CLSR STERI-STRIP ANTIMIC 1/2X4 (GAUZE/BANDAGES/DRESSINGS) ×2 IMPLANT
COVER SURGICAL LIGHT HANDLE (MISCELLANEOUS) ×2 IMPLANT
COVER WAND RF STERILE (DRAPES) IMPLANT
DECANTER SPIKE VIAL GLASS SM (MISCELLANEOUS) ×6 IMPLANT
DRAPE 3/4 80X56 (DRAPES) ×2 IMPLANT
DRAPE C-ARM 42X120 X-RAY (DRAPES) ×2 IMPLANT
DRAPE INCISE IOBAN 66X45 STRL (DRAPES) ×2 IMPLANT
DRAPE ORTHO SPLIT 77X108 STRL (DRAPES) ×4
DRAPE POUCH INSTRU U-SHP 10X18 (DRAPES) ×2 IMPLANT
DRAPE SURG ORHT 6 SPLT 77X108 (DRAPES) ×2 IMPLANT
DRAPE U-SHAPE 47X51 STRL (DRAPES) ×2 IMPLANT
DRESSING AQUACEL AG SP 3.5X10 (GAUZE/BANDAGES/DRESSINGS) ×1 IMPLANT
DRSG AQUACEL AG SP 3.5X10 (GAUZE/BANDAGES/DRESSINGS) ×2
DURAPREP 26ML APPLICATOR (WOUND CARE) ×2 IMPLANT
ELECT BLADE TIP CTD 4 INCH (ELECTRODE) ×2 IMPLANT
ELECT REM PT RETURN 15FT ADLT (MISCELLANEOUS) ×2 IMPLANT
FACESHIELD WRAPAROUND (MASK) ×10 IMPLANT
FACESHIELD WRAPAROUND OR TEAM (MASK) ×3 IMPLANT
GLOVE BIOGEL PI IND STRL 8 (GLOVE) ×2 IMPLANT
GLOVE BIOGEL PI INDICATOR 8 (GLOVE) ×2
GLOVE SURG ORTHO 8.0 STRL STRW (GLOVE) ×2 IMPLANT
GLOVE SURG SS PI 7.5 STRL IVOR (GLOVE) ×2 IMPLANT
GOWN STRL REUS W/TWL XL LVL3 (GOWN DISPOSABLE) ×4 IMPLANT
HEAD CERAMIC 36 PLUS 8.5 12 14 (Hips) ×1 IMPLANT
HOLDER FOLEY CATH W/STRAP (MISCELLANEOUS) ×2 IMPLANT
HOOD PEEL AWAY FLYTE STAYCOOL (MISCELLANEOUS) ×2 IMPLANT
IMMOBILIZER KNEE 20 (SOFTGOODS) ×2
IMMOBILIZER KNEE 20 THIGH 36 (SOFTGOODS) ×1 IMPLANT
KIT BASIN (CUSTOM PROCEDURE TRAY) ×2 IMPLANT
KIT TURNOVER KIT A (KITS) IMPLANT
LINER NEUTRAL 52X36X52 PLUS 4 (Liner) ×1 IMPLANT
MANIFOLD NEPTUNE II (INSTRUMENTS) ×2 IMPLANT
NEEDLE HYPO 22GX1.5 SAFETY (NEEDLE) ×4 IMPLANT
NS IRRIG 1000ML POUR BTL (IV SOLUTION) ×2 IMPLANT
PACK TOTAL JOINT (CUSTOM PROCEDURE TRAY) ×2 IMPLANT
PENCIL SMOKE EVACUATOR (MISCELLANEOUS) IMPLANT
PIN SECTOR W/GRIP ACE CUP 52MM (Hips) ×1 IMPLANT
PROTECTOR NERVE ULNAR (MISCELLANEOUS) ×2 IMPLANT
STAPLER VISISTAT 35W (STAPLE) IMPLANT
STEM FEM CMNTLSS SM AML 15.0 (Hips) ×1 IMPLANT
SUCTION FRAZIER HANDLE 12FR (TUBING) ×2
SUCTION TUBE FRAZIER 12FR DISP (TUBING) ×1 IMPLANT
SUT ETHIBOND NAB CT1 #1 30IN (SUTURE) ×8 IMPLANT
SUT MNCRL AB 3-0 PS2 18 (SUTURE) ×2 IMPLANT
SUT VIC AB 0 CT1 36 (SUTURE) ×4 IMPLANT
SUT VIC AB 2-0 CT1 27 (SUTURE) ×4
SUT VIC AB 2-0 CT1 TAPERPNT 27 (SUTURE) ×2 IMPLANT
SYR CONTROL 10ML LL (SYRINGE) ×4 IMPLANT
TOWEL OR 17X26 10 PK STRL BLUE (TOWEL DISPOSABLE) ×2 IMPLANT
TRAY FOLEY MTR SLVR 16FR STAT (SET/KITS/TRAYS/PACK) ×2 IMPLANT
WATER STERILE IRR 1000ML POUR (IV SOLUTION) ×4 IMPLANT
YANKAUER SUCT BULB TIP 10FT TU (MISCELLANEOUS) ×2 IMPLANT
YANKAUER SUCT BULB TIP NO VENT (SUCTIONS) ×1 IMPLANT

## 2019-10-14 NOTE — Transfer of Care (Signed)
Immediate Anesthesia Transfer of Care Note  Patient: Javier Taylor  Procedure(s) Performed: Procedure(s): TOTAL HIP ARTHROPLASTY (Right)  Patient Location: PACU  Anesthesia Type:Spinal  Level of Consciousness:  sedated, patient cooperative and responds to stimulation  Airway & Oxygen Therapy:Patient Spontanous Breathing and Patient connected to face mask oxgen  Post-op Assessment:  Report given to PACU RN and Post -op Vital signs reviewed and stable  Post vital signs:  Reviewed and stable  Last Vitals:  Vitals:   10/14/19 0604 10/14/19 1032  BP: (!) 142/95   Pulse: 79   Resp: 18   Temp: 36.8 C (!) (P) 36.4 C  SpO2: 100%     Complications: No apparent anesthesia complications

## 2019-10-14 NOTE — Op Note (Signed)
NAMEVASHON, RIORDAN MEDICAL RECORD [id number] ACCOUNT [id number] DATE OF BIRTH:1970/09/19 FACILITY: [facility] LOCATION: [facility] PHYSICIAN:W. Timmie Dugue JR., MD  OPERATIVE REPORT  DATE OF PROCEDURE:  10/14/2019  PREOPERATIVE  DIAGNOSIS:  Severe osteoarthritis, right hip.  POSTOPERATIVE DIAGNOSIS:  Severe osteoarthritis, right hip.  OPERATION:  Right total hip replacement (DePuy AML small stature stem, 15 mm with +8.5 mm neck length with 36 mm Biolox ceramic femoral head, a 52 mm Gription cup, 52 mm with a +4 mm, 10-degree Pinnacle AltrX polyethylene liner.  SURGEON:  Marcie Mowers, MD  ASSISTANT:  Lonia Chimera, PA  ANESTHESIA:  Spinal.  ESTIMATED BLOOD LOSS:  600 mL.  DESCRIPTION OF PROCEDURE:  Lateral position to the hip with posterior approach, we split the iliotibial band and gluteus maximus.  A T capsulotomy in the hip.  Delivered the hip.  We cut the head about 1 fingerbreadth above the lesser trochanter and then  progressively reamed and then rasped to eventually a 15 mm small stature stem.  The patient, relatively healthy, very dense bone.  We confirmed good positioning of the reamer and the rasp into the femoral shaft along with the C-arm.  The acetabulum was  next prepared with reaming for a 0.5 mm tibia, 1 mm under reaming.  We reamed in approximately 45 degrees abduction, 15-20 degrees anteversion.  A 52 mm cup was placed with the trial liner, trial placed off the permanent acetabular liner.  We then  trialed off the rasp as well, settled on the above-mentioned neck lengths.  Dislocation was only possible with extreme flexion past 100 degrees, adduction maximally with internal rotation of about 60 when the hip did tend to sublux.  Leg lengths seemed  to be restored well.  Final components were inserted after the acetabular shell was inserted with the polyethylene liner at about the 9 o'clock position for the buildup and we then settled on the +8.5 mm neck length.  The  T capsulotomy was closed with  interrupted Ethibond, the fascia lata and gluteus maximus fascia with a running Ethibond, subcutaneous tissue with 2-0 Vicryl and skin with Monocryl.  A light compressive sterile dressing was applied.  He was taken to recovery room in stable condition.  VN/NUANCE  D:10/14/2019 T:10/14/2019 JOB:[id number]

## 2019-10-14 NOTE — Evaluation (Signed)
Physical Therapy Evaluation Patient Details Name: Javier Taylor MRN: [id number] DOB: 10-12-1970 Today's Date: 10/14/2019   History of Present Illness  Patient is 49 y.o. male s/p Rt THA posterior approach with PMH significant for HTN, OA, angina, L5 surgery.     Clinical Impression  DAHLTON HINDE is a 49 y.o. male POD 0 s/p Rt THA posterior approach. Patient reports independence with mobility at baseline. Patient is now limited by functional impairments (see PT problem list below) and requires min assist for transfers and gait with RW. Patient was able to ambulate ~90 feet with RW and min guard/assist. Patient instructed in exercise to facilitate circulation. Patient will benefit from continued skilled PT interventions to address impairments and progress towards PLOF. Acute PT will follow to progress mobility and stair training in preparation for safe discharge home.     Follow Up Recommendations Follow surgeons recommendation for DC plan and follow-up therapies;Outpatient PT    Equipment Recommendations  Rolling walker with 5" wheels;3in1 (PT)    Recommendations for Other Services       Precautions / Restrictions Precautions Precautions: Posterior Hip;Fall Precaution Booklet Issued: Yes (comment) Restrictions Weight Bearing Restrictions: No Other Position/Activity Restrictions: WBAT      Mobility  Bed Mobility Overal bed mobility: Needs Assistance Bed Mobility: Supine to Sit     Supine to sit: Min assist;HOB elevated     General bed mobility comments: cues for sequencing LE mobility and [place] of bed rail. Assist for Rt LE movement to EOB.   Transfers Overall transfer level: Needs assistance Equipment used: Rolling walker (2 wheeled) Transfers: Sit to/from Stand Sit to Stand: Min assist;From elevated surface         General transfer comment: cues for safe hand placement/technique with RW. light assist to rise from EOB. VC's for safe reach back and to kick Rt LE  forward to maintain posterior hip precautions.  Ambulation/Gait Ambulation/Gait assistance: Min assist;Min guard Gait Distance (Feet): 90 Feet Assistive device: Rolling walker (2 wheeled) Gait Pattern/deviations: Step-to pattern;Decreased stride length;Decreased weight shift to right Gait velocity: fair   General Gait Details: cues for safe hand placement on RW, safe proximity, and step pattern. intermittent assist for walker positioning.   Stairs       Wheelchair Mobility    Modified Rankin (Stroke Patients Only)       Balance Overall balance assessment: Needs assistance Sitting-balance support: Feet supported Sitting balance-Leahy Scale: Good     Standing balance support: During functional activity;Bilateral upper extremity supported Standing balance-Leahy Scale: Fair            Pertinent Vitals/Pain Pain Assessment: Faces Faces Pain Scale: Hurts little more Pain Location: Rt hip Pain Descriptors / Indicators: Aching;Discomfort;Grimacing Pain Intervention(s): Limited activity within patient's tolerance;Monitored during session;Repositioned;Ice applied;RN gave pain meds during session    [place] expects to be discharged to:: Private residence Living Arrangements: Alone;Spouse/significant other;Children Available Help at Discharge: Family;Friend(s) Type of Home: House Home Access: Stairs to enter Entrance Stairs-Rails: None Entrance Stairs-Number of Steps: 1+1 Home Layout: One level Home Equipment: None Additional Comments: pt's daughter lives with him sometimes she is gettng ready to start college. His girlfriend is available to help as well.    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     Hand Dominance   Dominant Hand: Right    Extremity/Trunk Assessment   Upper Extremity Assessment Upper Extremity Assessment:  Overall WFL for tasks assessed     Lower Extremity Assessment Lower Extremity Assessment: Overall WFL for tasks assessed;RLE deficits/detail RLE: Unable to fully assess due to immobilization    Cervical / Trunk Assessment Cervical / Trunk Assessment: Normal  Communication   Communication: No difficulties  Cognition Arousal/Alertness: Awake/alert Behavior During Therapy: WFL for tasks assessed/performed Overall Cognitive Status: Within Functional Limits for tasks assessed             General Comments      Exercises Total Joint Exercises Ankle Circles/Pumps: AROM;20 reps;Seated;Both   Assessment/Plan    PT Assessment Patient needs continued PT services  PT Problem List Decreased strength;Decreased range of motion;Decreased activity tolerance;Decreased balance;Decreased mobility;Decreased knowledge of use of DME;Decreased safety awareness;Decreased knowledge of precautions       PT Treatment Interventions DME instruction;Gait training;Stair training;Functional mobility training;Therapeutic activities;Therapeutic exercise;Balance training;Patient/family education    PT Goals (Current goals can be found in the Care Plan section)  Acute Rehab PT Goals Patient Stated Goal: be able to walk and jog again without pain, be able to ride his motocycle again without pain.  PT Goal Formulation: With patient Time For Goal Achievement: 10/21/19 Potential to Achieve Goals: Good    Frequency 7X/week    AM-PAC PT "6 Clicks" Mobility  Outcome Measure Help needed turning from your back to your side while in a flat bed without using bedrails?: A Little Help needed moving from lying on your back to sitting on the side of a flat bed without using bedrails?: A Little Help needed moving to and from a bed to a chair (including a wheelchair)?: A Little Help needed standing up from a chair using your arms (e.g., wheelchair or bedside chair)?: A Little Help needed to walk in hospital room?: A Little Help needed climbing 3-5  steps with a railing? : A Little 6 Click Score: 18    End of Session Equipment Utilized During Treatment: Gait belt;Right knee immobilizer Activity Tolerance: Patient tolerated treatment well Patient left: in chair;with call bell/phone within reach;with chair alarm set;with family/visitor present Nurse Communication: Mobility status PT Visit Diagnosis: Muscle weakness (generalized) (M62.81);Difficulty in walking, not elsewhere classified (R26.2)    Time: 1422-1450 PT Time Calculation (min) (ACUTE ONLY): 28 min   Charges:   PT Evaluation $PT Eval Low Complexity: 1 Low PT Treatments $Gait Training: 8-22 mins        Wynn Maudlin, DPT [facility] Acute Rehabilitation Services  Office [phone] Pager [phone]  10/14/2019 3:12 PM

## 2019-10-14 NOTE — Interval H&P Note (Signed)
History and Physical Interval Note:  10/14/2019 7:36 AM  Javier Taylor  has presented today for surgery, with the diagnosis of OA RIGHT HIP.  The various methods of treatment have been discussed with the patient and family. After consideration of risks, benefits and other options for treatment, the patient has consented to  Procedure(s): TOTAL HIP ARTHROPLASTY (Right) as a surgical intervention.  The patient's history has been reviewed, patient examined, no change in status, stable for surgery.  I have reviewed the patient's chart and labs.  Questions were answered to the patient's satisfaction.     Thera Flake

## 2019-10-14 NOTE — Brief Op Note (Signed)
10/14/2019  10:51 AM  PATIENT:  Javier Taylor  49 y.o. male  PRE-OPERATIVE DIAGNOSIS:  OA RIGHT HIP  POST-OPERATIVE DIAGNOSIS:  OA RIGHT HIP  PROCEDURE:  Procedure(s): TOTAL HIP ARTHROPLASTY (Right)  SURGEON:  Surgeon(s) and Role:    * Frederico Hamman, MD - Primary  PHYSICIAN ASSISTANT:  Margart Sickles, PA-C  ASSISTANTS: OR staff x1   ANESTHESIA:   local, spinal and IV sedation  EBL:  600 mL   BLOOD ADMINISTERED:none  DRAINS: none   LOCAL MEDICATIONS USED:  MARCAINE     SPECIMEN:  No Specimen  DISPOSITION OF SPECIMEN:  N/A  COUNTS:  YES  TOURNIQUET:  * No tourniquets in log *  DICTATION: .Other Dictation: Dictation Number unknown  PLAN OF CARE: Admit for overnight observation  PATIENT DISPOSITION:  PACU - hemodynamically stable.   Delay start of Pharmacological VTE agent (>24hrs) due to surgical blood loss or risk of bleeding: yes

## 2019-10-14 NOTE — TOC Transition Note (Signed)
Transition of Care [facility]) - CM/SW Discharge Note   Patient Details  Name: Javier Taylor MRN: [id number] Date of Birth: 11/06/1970  [facility] CM/SW Contact:  Lennart Pall, LCSW Phone Number: 10/14/2019, 2:59 PM   Clinical Narrative:    Met with pt to review d/c needs.  DME ordered (see below).  Pt confirms plan in place for OPPT at [facility] PT in [place].  No further TOC needs.   Final next level of care: OP Rehab     Patient Goals and CMS Choice        Discharge Placement                       Discharge Plan and Services                DME Arranged: 3-N-1, Walker rolling DME Agency: [employer] Date DME Agency Contacted: 10/14/19 Time DME Agency Contacted: [phone] Representative spoke with at DME Agency: [place]: NA [place] Agency: NA        Social Determinants of Health ([place]) Interventions     Readmission Risk Interventions No flowsheet data found.

## 2019-10-15 DIAGNOSIS — Z87442 Personal history of urinary calculi: Secondary | ICD-10-CM | POA: Diagnosis not present

## 2019-10-15 DIAGNOSIS — I471 Supraventricular tachycardia: Secondary | ICD-10-CM | POA: Diagnosis not present

## 2019-10-15 DIAGNOSIS — Z791 Long term (current) use of non-steroidal anti-inflammatories (NSAID): Secondary | ICD-10-CM | POA: Diagnosis not present

## 2019-10-15 DIAGNOSIS — Z79899 Other long term (current) drug therapy: Secondary | ICD-10-CM | POA: Diagnosis not present

## 2019-10-15 DIAGNOSIS — I10 Essential (primary) hypertension: Secondary | ICD-10-CM | POA: Diagnosis not present

## 2019-10-15 DIAGNOSIS — Z87891 Personal history of nicotine dependence: Secondary | ICD-10-CM | POA: Diagnosis not present

## 2019-10-15 DIAGNOSIS — M1611 Unilateral primary osteoarthritis, right hip: Secondary | ICD-10-CM | POA: Diagnosis not present

## 2019-10-15 LAB — BASIC METABOLIC PANEL
Anion gap: 6 (ref 5–15)
BUN: 15 mg/dL (ref 6–20)
CO2: 25 mmol/L (ref 22–32)
Calcium: 8.9 mg/dL (ref 8.9–10.3)
Chloride: 105 mmol/L (ref 98–111)
Creatinine, Ser: 0.74 mg/dL (ref 0.61–1.24)
GFR calc Af Amer: >60 mL/min (ref >60)
GFR calc non Af Amer: >60 mL/min (ref >60)
Glucose, Bld: 133 mg/dL — ABNORMAL HIGH (ref 70–99)
Potassium: 4.1 mmol/L (ref 3.5–5.1)
Sodium: 136 mmol/L (ref 135–145)

## 2019-10-15 LAB — CBC
HCT: 38.7 % — ABNORMAL LOW (ref 39.0–52.0)
Hemoglobin: 13.1 g/dL (ref 13.0–17.0)
MCH: 31.7 pg (ref 26.0–34.0)
MCHC: 33.9 g/dL (ref 30.0–36.0)
MCV: 93.7 fL (ref 80.0–100.0)
Platelets: 143 10*3/uL — ABNORMAL LOW (ref 150–400)
RBC: 4.13 MIL/uL — ABNORMAL LOW (ref 4.22–5.81)
RDW: 12.3 % (ref 11.5–15.5)
WBC: 10.2 10*3/uL (ref 4.0–10.5)
nRBC: 0 % (ref 0.0–0.2)

## 2019-10-15 NOTE — Progress Notes (Signed)
Orthopaedic Trauma Progress Note  S: Doing okay this morning, just received pain medication.  Ready to get up and work with therapy.  Wants to go home  O:  Vitals:   10/15/19 0224 10/15/19 0551  BP: 122/84 126/79  Pulse: 65 65  Resp: 16 16  Temp: 98 F (36.7 C) 97.9 F (36.6 C)  SpO2: 98% 96%    General: Sitting up in bed, no acute distress Respiratory: No increased work of breathing. Right lower extremity: Dressing is clean, dry, intact.  Knee immobilizer in place.  Some discomfort with palpation over the hip.  Ankle dorsiflexion/plantarflexion is intact.  Sensation is intact to light touch distally.  Neurovascularly intact  Labs:  Results for orders placed or performed during the hospital encounter of 10/14/19 (from the past 24 hour(s))  Prepare RBC (crossmatch)     Status: None   Collection Time: 10/14/19  9:12 AM  Result Value Ref Range   Order Confirmation      ORDER PROCESSED BY BLOOD BANK Performed at [facility], [address] [place], [place] [address]   CBC     Status: Abnormal   Collection Time: 10/15/19  2:33 AM  Result Value Ref Range   WBC 10.2 4.0 - 10.5 K/uL   RBC 4.13 (L) 4.22 - 5.81 MIL/uL   Hemoglobin 13.1 13.0 - 17.0 g/dL   HCT 82.4 (L) 39 - 52 %   MCV 93.7 80.0 - 100.0 fL   MCH 31.7 26.0 - 34.0 pg   MCHC 33.9 30.0 - 36.0 g/dL   RDW 23.5 36.1 - 44.3 %   Platelets 143 (L) 150 - 400 K/uL   nRBC 0.0 0.0 - 0.2 %  Basic metabolic panel     Status: Abnormal   Collection Time: 10/15/19  2:33 AM  Result Value Ref Range   Sodium 136 135 - 145 mmol/L   Potassium 4.1 3.5 - 5.1 mmol/L   Chloride 105 98 - 111 mmol/L   CO2 25 22 - 32 mmol/L   Glucose, Bld 133 (H) 70 - 99 mg/dL   BUN 15 6 - 20 mg/dL   Creatinine, Ser 1.54 0.61 - 1.24 mg/dL   Calcium 8.9 8.9 - 00.8 mg/dL   GFR calc non Af Amer >60 >60 mL/min   GFR calc Af Amer >60 >60 mL/min   Anion gap 6 5 - 15    Assessment: 49 year old male s/p right THA, 1 Day Post-Op     Weightbearing: WBAT RLE  Insicional and dressing care: Change dressing as needed  Showering: Okay to begin showering, Aquacel dressing may get wet  Orthopedic device(s): None   CV/Blood loss: Hgb 13.1 this morning. Hemodynamically stable  Pain management: Continue current regimen  VTE prophylaxis: Aspirin 81 mg   Foley/Lines: No foley, KVO IVFs  Medical co-morbidities: Hypertension, dysrhythmia, history of kidney stones  Dispo: Up with therapies this morning.  Plan for discharge today  Follow - up plan: 2 weeks with Dr. Levonne Lapping A. Ladonna Snide [facility] [phone] (office) [email or web address]

## 2019-10-15 NOTE — Discharge Instructions (Signed)

## 2019-10-15 NOTE — Progress Notes (Signed)
OT Cancellation Note and Discharge from OT  Patient Details Name: Javier Taylor MRN: [id number] DOB: [birth year]   Cancelled Treatment:    Reason Eval/Treat Not Completed: Patient at procedure or test/ unavailable. Attempted x2 this morning, 1st declined due to pain, 2nd working with PT. When OT attempted at 11:30 - Pt was discharging and verbalized that he had no questions or concerns about ADL/transfers or posterior hip precautions in regards to ADL/transfers.   Evern Bio Valori Hollenkamp 10/15/2019, 11:41 AM   Nyoka Cowden OTR/L Acute Rehabilitation Services Pager: [phone] Office: [phone]

## 2019-10-15 NOTE — Progress Notes (Signed)
Physical Therapy Treatment Patient Details Name: Javier Taylor MRN: [id number] DOB: 1970/08/30 Today's Date: 10/15/2019    History of Present Illness Patient is 49 y.o. male s/p Rt THA posterior approach with PMH significant for HTN, OA, angina, L5 surgery.     PT Comments    Pt progressing well with mobility despite increased pain.  Pt requiring min assist to sup for performance of all mobility tasks and with cues to slow pace for safety and adherence to THP.  Pt and family reviewed THP,  car transfers, navigated stairs and reviewed HEP - written instruction provided.   Follow Up Recommendations  Follow surgeon's recommendation for DC plan and follow-up therapies;Outpatient PT     Equipment Recommendations  Rolling walker with 5" wheels;3in1 (PT)    Recommendations for Other Services       Precautions / Restrictions Precautions Precautions: Posterior Hip;Fall Precaution Booklet Issued: Yes (comment) Restrictions Weight Bearing Restrictions: No Other Position/Activity Restrictions: WBAT    Mobility  Bed Mobility Overal bed mobility: Needs Assistance Bed Mobility: Supine to Sit     Supine to sit: Min assist;HOB elevated     General bed mobility comments: cues for sequence and use of L LE to self assist.  Physical assist to manage R LE  Transfers Overall transfer level: Needs assistance Equipment used: Rolling walker (2 wheeled) Transfers: Sit to/from Stand Sit to Stand: Min guard;Supervision         General transfer comment: cues for LE management, adherence to THP and use of UEs to self assist  Ambulation/Gait Ambulation/Gait assistance: Min guard;Supervision Gait Distance (Feet): 180 Feet Assistive device: Rolling walker (2 wheeled) Gait Pattern/deviations: Step-to pattern;Decreased stride length;Decreased weight shift to right Gait velocity: fair   General Gait Details: cues for safe hand placement on RW, safe proximity, and step pattern. intermittent  assist for walker positioning.    Stairs Stairs: Yes Stairs assistance: Min assist Stair Management: No rails;Step to pattern;Forwards;With walker Number of Stairs: 3 General stair comments: single step x 3 - once bkwd and twice fwd; cues for sequence and foot/RW placement   Wheelchair Mobility    Modified Rankin (Stroke Patients Only)       Balance Overall balance assessment: Needs assistance Sitting-balance support: Feet supported Sitting balance-Leahy Scale: Good     Standing balance support: During functional activity;Bilateral upper extremity supported Standing balance-Leahy Scale: Fair                              Cognition Arousal/Alertness: Awake/alert Behavior During Therapy: WFL for tasks assessed/performed Overall Cognitive Status: Within Functional Limits for tasks assessed                                        Exercises Total Joint Exercises Ankle Circles/Pumps: AROM;20 reps;Seated;Both Quad Sets: AROM;Both;10 reps;Supine Heel Slides: AAROM;Right;20 reps;Supine Hip ABduction/ADduction: AAROM;Right;15 reps;Supine    General Comments        Pertinent Vitals/Pain Pain Assessment: Faces Faces Pain Scale: Hurts even more Pain Location: Rt hip Pain Descriptors / Indicators: Aching;Discomfort;Grimacing Pain Intervention(s): Limited activity within patient's tolerance;Monitored during session;Premedicated before session;Ice applied    Home Living                      Prior Function            PT Goals (current  goals can now be found in the care plan section) Acute Rehab PT Goals Patient Stated Goal: be able to walk and jog again without pain, be able to ride his motocycle again without pain.  PT Goal Formulation: With patient Time For Goal Achievement: 10/21/19 Potential to Achieve Goals: Good Progress towards PT goals: Progressing toward goals    Frequency    7X/week      PT Plan Current plan  remains appropriate    Co-evaluation              AM-PAC PT "6 Clicks" Mobility   Outcome Measure  Help needed turning from your back to your side while in a flat bed without using bedrails?: A Little Help needed moving from lying on your back to sitting on the side of a flat bed without using bedrails?: A Little Help needed moving to and from a bed to a chair (including a wheelchair)?: A Little Help needed standing up from a chair using your arms (e.g., wheelchair or bedside chair)?: A Little Help needed to walk in hospital room?: A Little Help needed climbing 3-5 steps with a railing? : A Little 6 Click Score: 18    End of Session Equipment Utilized During Treatment: Gait belt Activity Tolerance: Patient tolerated treatment well Patient left: in chair;with call bell/phone within reach;with chair alarm set;with family/visitor present Nurse Communication: Mobility status PT Visit Diagnosis: Muscle weakness (generalized) (M62.81);Difficulty in walking, not elsewhere classified (R26.2)     Time: 9563-8756 PT Time Calculation (min) (ACUTE ONLY): 45 min  Charges:  $Gait Training: 8-22 mins $Therapeutic Exercise: 8-22 mins $Therapeutic Activity: 8-22 mins                     Debe Coder PT Acute Rehabilitation Services Pager [phone] Office [phone]    [facility] 10/15/2019, 12:39 PM

## 2019-10-15 NOTE — Progress Notes (Signed)
Pt stable at time of d/c instructions and education. No needs at this time. Pt dressing dry and intact.

## 2019-10-15 NOTE — Plan of Care (Signed)
Pt stable at this time. Pt to d/c home with family. No needs at this time. Rn continuing to medicate for pain.

## 2019-10-17 ENCOUNTER — Encounter (HOSPITAL_COMMUNITY): Payer: Self-pay | Admitting: Orthopedic Surgery

## 2019-10-17 DIAGNOSIS — M1611 Unilateral primary osteoarthritis, right hip: Secondary | ICD-10-CM | POA: Diagnosis not present

## 2019-10-18 LAB — TYPE AND SCREEN
ABO/RH(D): A POS
Antibody Screen: NEGATIVE
Unit division: 0
Unit division: 0

## 2019-10-18 LAB — BPAM RBC
Blood Product Expiration Date: 202106202359
Blood Product Expiration Date: 202106242359
Unit Type and Rh: 6200
Unit Type and Rh: 6200

## 2019-10-19 ENCOUNTER — Other Ambulatory Visit: Payer: Self-pay | Admitting: Orthopedic Surgery

## 2019-10-19 ENCOUNTER — Other Ambulatory Visit (HOSPITAL_COMMUNITY): Payer: Self-pay | Admitting: Orthopedic Surgery

## 2019-10-19 ENCOUNTER — Other Ambulatory Visit: Payer: Self-pay

## 2019-10-19 ENCOUNTER — Ambulatory Visit (HOSPITAL_COMMUNITY)
Admission: RE | Admit: 2019-10-19 | Discharge: 2019-10-19 | Disposition: A | Discharge status: Home / Self Care | Payer: BC Managed Care – PPO | Source: Ambulatory Visit | Attending: Orthopedic Surgery | Admitting: Orthopedic Surgery

## 2019-10-19 DIAGNOSIS — M79661 Pain in right lower leg: Secondary | ICD-10-CM | POA: Insufficient documentation

## 2019-10-19 DIAGNOSIS — M7989 Other specified soft tissue disorders: Secondary | ICD-10-CM

## 2019-10-19 DIAGNOSIS — M1611 Unilateral primary osteoarthritis, right hip: Secondary | ICD-10-CM | POA: Diagnosis not present

## 2019-10-20 DIAGNOSIS — M1611 Unilateral primary osteoarthritis, right hip: Secondary | ICD-10-CM | POA: Diagnosis not present

## 2019-10-28 DIAGNOSIS — M1611 Unilateral primary osteoarthritis, right hip: Secondary | ICD-10-CM | POA: Diagnosis not present

## 2019-10-31 NOTE — Anesthesia Postprocedure Evaluation (Signed)
Anesthesia Post Note  Patient: Javier Taylor  Procedure(s) Performed: TOTAL HIP ARTHROPLASTY (Right Hip)     Patient location during evaluation: PACU Anesthesia Type: Spinal Level of consciousness: oriented and awake and alert Pain management: pain level controlled Vital Signs Assessment: post-procedure vital signs reviewed and stable Respiratory status: spontaneous breathing, respiratory function stable and patient connected to nasal cannula oxygen Cardiovascular status: blood pressure returned to baseline and stable Postop Assessment: no headache, no backache and no apparent nausea or vomiting Anesthetic complications: no   No complications documented.  Last Vitals:  Vitals:   10/15/19 0551 10/15/19 0931  BP: 126/79 124/83  Pulse: 65 77  Resp: 16 17  Temp: 36.6 C 36.6 C  SpO2: 96% 96%    Last Pain:  Vitals:   10/15/19 1011  TempSrc:   PainSc: 2                  Chadwick Reiswig S

## 2019-10-31 NOTE — Anesthesia Procedure Notes (Signed)
Spinal  Patient location during procedure: OR Start time: 10/31/2019 7:41 AM End time: 10/31/2019 7:46 AM Staffing Anesthesiologist: Eilene Ghazi, MD Preanesthetic Checklist Completed: patient identified, IV checked, site marked, risks and benefits discussed, surgical consent, monitors and equipment checked, pre-op evaluation and timeout performed Spinal Block Patient position: sitting Prep: DuraPrep Patient monitoring: heart rate, cardiac monitor, continuous pulse ox and blood pressure Approach: midline Location: L3-4 Injection technique: single-shot Needle Needle type: Sprotte  Needle gauge: 24 G Needle length: 9 cm Assessment Sensory level: T6

## 2019-10-31 NOTE — Addendum Note (Signed)
Addendum  created 10/31/19 0954 by Eilene Ghazi, MD   Child order released for a procedure order, Clinical Note Signed, Intraprocedure Blocks edited

## 2019-11-11 DIAGNOSIS — M1611 Unilateral primary osteoarthritis, right hip: Secondary | ICD-10-CM | POA: Diagnosis not present

## 2019-11-11 DIAGNOSIS — M545 Low back pain: Secondary | ICD-10-CM | POA: Diagnosis not present

## 2019-11-12 DIAGNOSIS — M545 Low back pain: Secondary | ICD-10-CM | POA: Diagnosis not present

## 2019-11-14 DIAGNOSIS — M545 Low back pain: Secondary | ICD-10-CM | POA: Diagnosis not present

## 2019-11-15 DIAGNOSIS — M545 Low back pain: Secondary | ICD-10-CM | POA: Diagnosis not present

## 2019-11-15 DIAGNOSIS — M25551 Pain in right hip: Secondary | ICD-10-CM | POA: Diagnosis not present

## 2019-11-17 DIAGNOSIS — M5416 Radiculopathy, lumbar region: Secondary | ICD-10-CM | POA: Diagnosis not present

## 2019-11-22 DIAGNOSIS — M25551 Pain in right hip: Secondary | ICD-10-CM | POA: Diagnosis not present

## 2019-11-22 DIAGNOSIS — M1611 Unilateral primary osteoarthritis, right hip: Secondary | ICD-10-CM | POA: Diagnosis not present

## 2019-11-24 DIAGNOSIS — M1611 Unilateral primary osteoarthritis, right hip: Secondary | ICD-10-CM | POA: Diagnosis not present

## 2019-11-24 DIAGNOSIS — M25551 Pain in right hip: Secondary | ICD-10-CM | POA: Diagnosis not present

## 2019-11-29 DIAGNOSIS — M25551 Pain in right hip: Secondary | ICD-10-CM | POA: Diagnosis not present

## 2019-11-29 DIAGNOSIS — M1611 Unilateral primary osteoarthritis, right hip: Secondary | ICD-10-CM | POA: Diagnosis not present

## 2019-12-07 DIAGNOSIS — M1611 Unilateral primary osteoarthritis, right hip: Secondary | ICD-10-CM | POA: Diagnosis not present

## 2019-12-07 DIAGNOSIS — M25551 Pain in right hip: Secondary | ICD-10-CM | POA: Diagnosis not present

## 2020-01-02 DIAGNOSIS — M25552 Pain in left hip: Secondary | ICD-10-CM | POA: Diagnosis not present

## 2020-01-05 DIAGNOSIS — I10 Essential (primary) hypertension: Secondary | ICD-10-CM | POA: Diagnosis not present

## 2020-01-05 DIAGNOSIS — Z7189 Other specified counseling: Secondary | ICD-10-CM | POA: Diagnosis not present

## 2020-01-05 DIAGNOSIS — Z0001 Encounter for general adult medical examination with abnormal findings: Secondary | ICD-10-CM | POA: Diagnosis not present

## 2020-01-05 DIAGNOSIS — M545 Low back pain: Secondary | ICD-10-CM | POA: Diagnosis not present

## 2020-01-05 DIAGNOSIS — Z6830 Body mass index (BMI) 30.0-30.9, adult: Secondary | ICD-10-CM | POA: Diagnosis not present

## 2020-01-05 DIAGNOSIS — Z6829 Body mass index (BMI) 29.0-29.9, adult: Secondary | ICD-10-CM | POA: Diagnosis not present

## 2020-01-05 DIAGNOSIS — I471 Supraventricular tachycardia: Secondary | ICD-10-CM | POA: Diagnosis not present

## 2020-03-16 DIAGNOSIS — M25511 Pain in right shoulder: Secondary | ICD-10-CM | POA: Diagnosis not present

## 2020-03-16 DIAGNOSIS — M25512 Pain in left shoulder: Secondary | ICD-10-CM | POA: Diagnosis not present

## 2020-04-06 DIAGNOSIS — M25552 Pain in left hip: Secondary | ICD-10-CM | POA: Diagnosis not present

## 2022-02-12 IMAGING — DX DG PORTABLE PELVIS
1 series · 1 of 1 positions shown · non-contrast
Comparison: Abdomen and pelvis CT dated 12/29/2018.

CLINICAL DATA: Status post right hip replacement.

EXAM:
PORTABLE PELVIS 1-2 VIEWS

[pelvis ap]
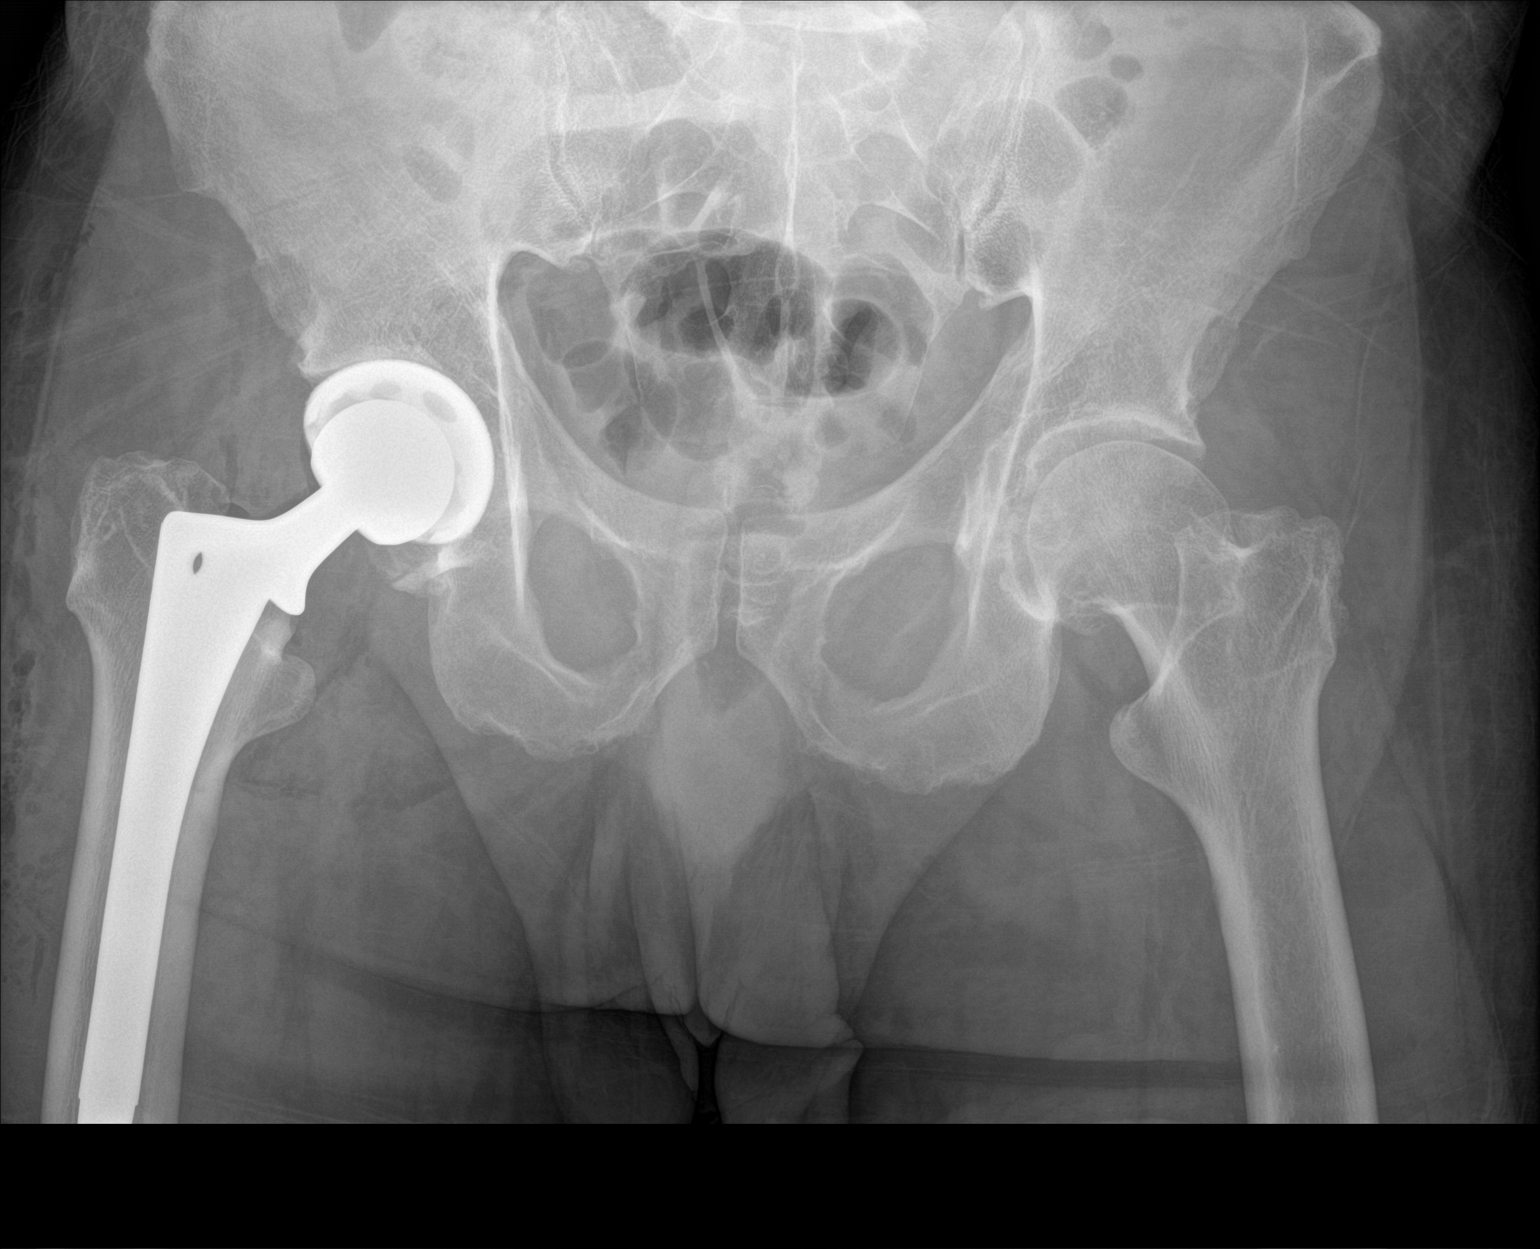

[1 of 1 positions shown; findings below may reference images not displayed]

FINDINGS: Interval right bipolar hip prosthesis in satisfactory position and
alignment. No fracture or dislocation seen.
IMPRESSION: Satisfactory postoperative appearance of a right hip prosthesis.

## 2022-02-12 IMAGING — DX DG HIP (WITH OR WITHOUT PELVIS) 1V PORT*R*
1 series · 1 of 1 positions shown · non-contrast
Comparison: Abdomen and pelvis CT dated 12/29/2018.

CLINICAL DATA: Status post right hip replacement.

EXAM:
DG HIP (WITH OR WITHOUT PELVIS) 1V PORT RIGHT

[hip ap]
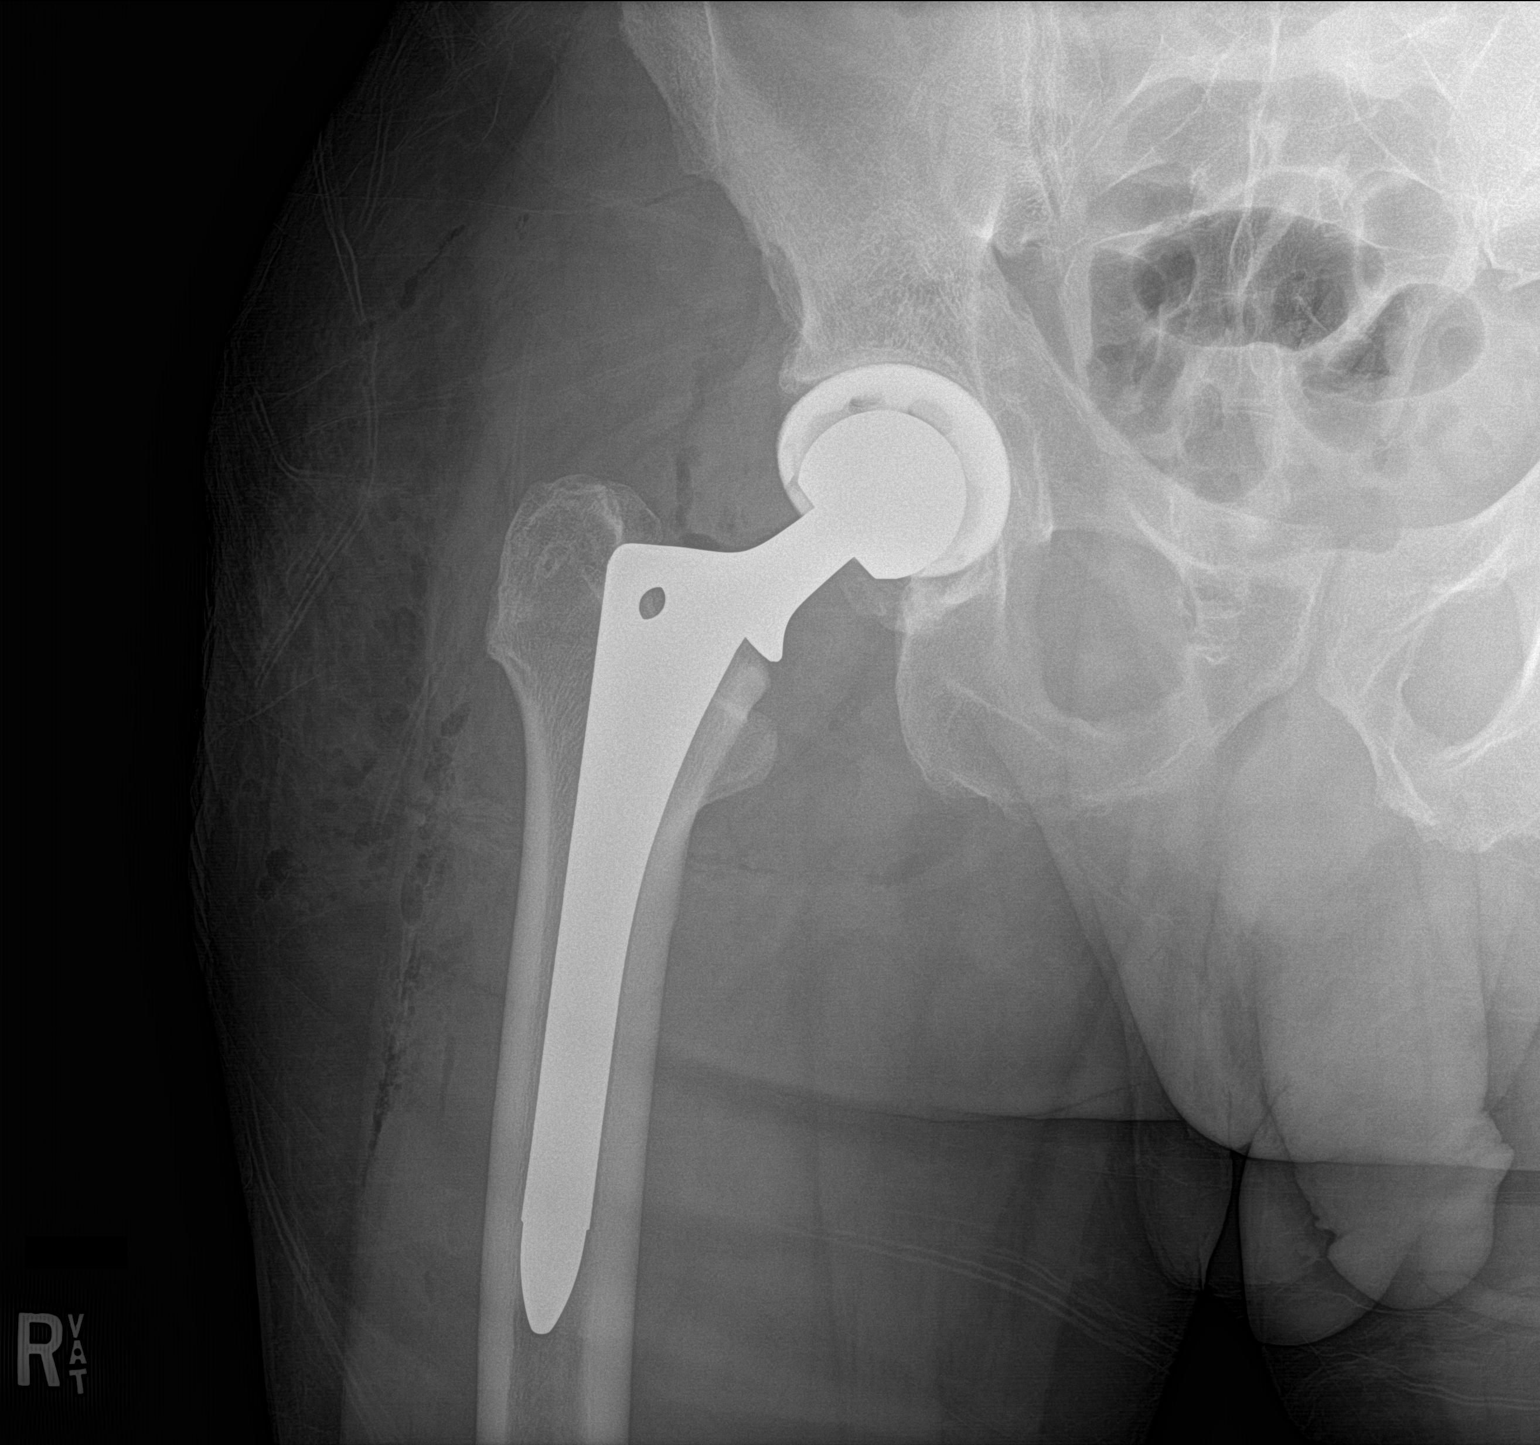

[1 of 1 positions shown; findings below may reference images not displayed]

FINDINGS: Interval right total scratch the interval right hip prosthesis in
satisfactory position and alignment. No fracture or dislocation
seen.
IMPRESSION: Satisfactory postoperative appearance of a right hip prosthesis.

## 2022-02-12 IMAGING — RF DG HIP (WITH PELVIS) OPERATIVE*R*
1 series · 5 of 5 positions shown · non-contrast
Comparison: Radiographs obtained today.

CLINICAL DATA: Right hip replacement.

EXAM:
OPERATIVE RIGHT HIP (WITH PELVIS IF PERFORMED) 5 VIEWS
TECHNIQUE: Fluoroscopic spot image(s) were submitted for interpretation
post-operatively.

[Series 1: run · 5 of 5 slices shown]
[im 1/5]
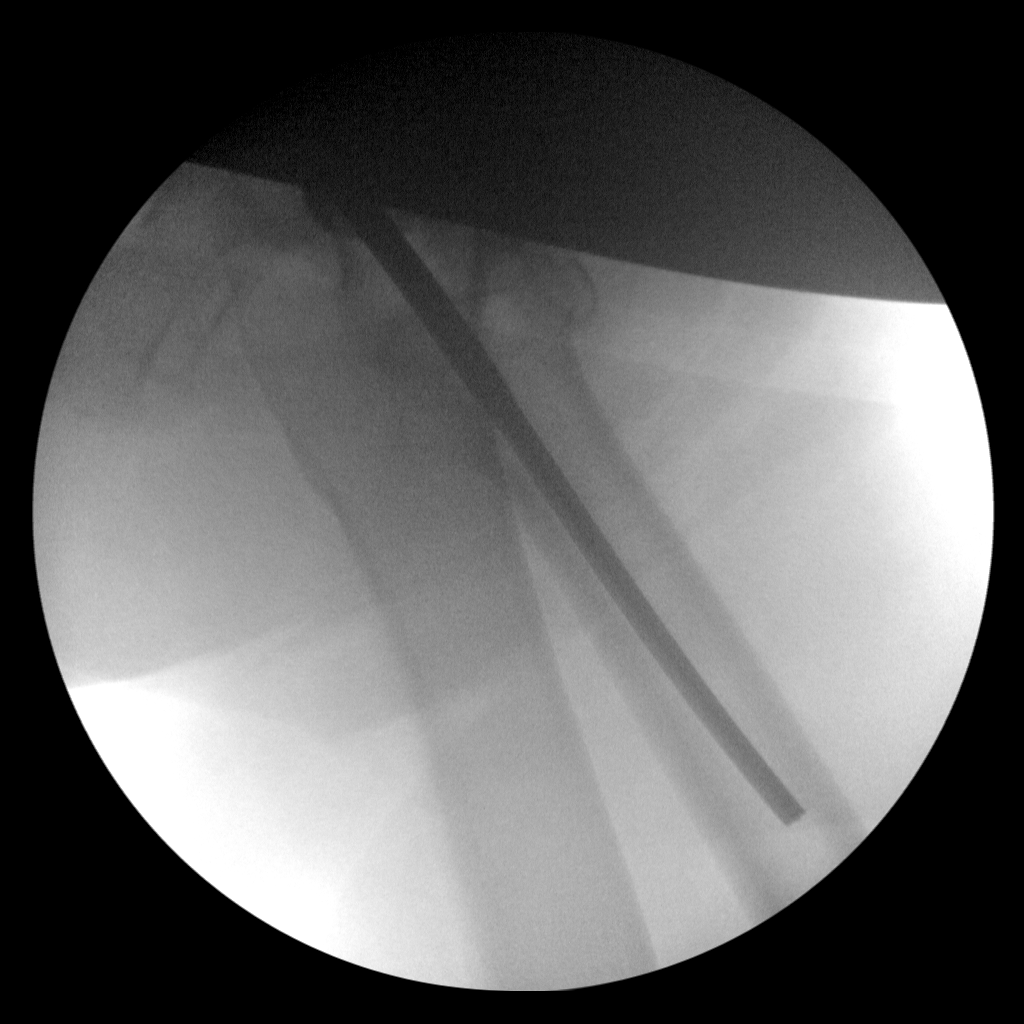
[im 2/5]
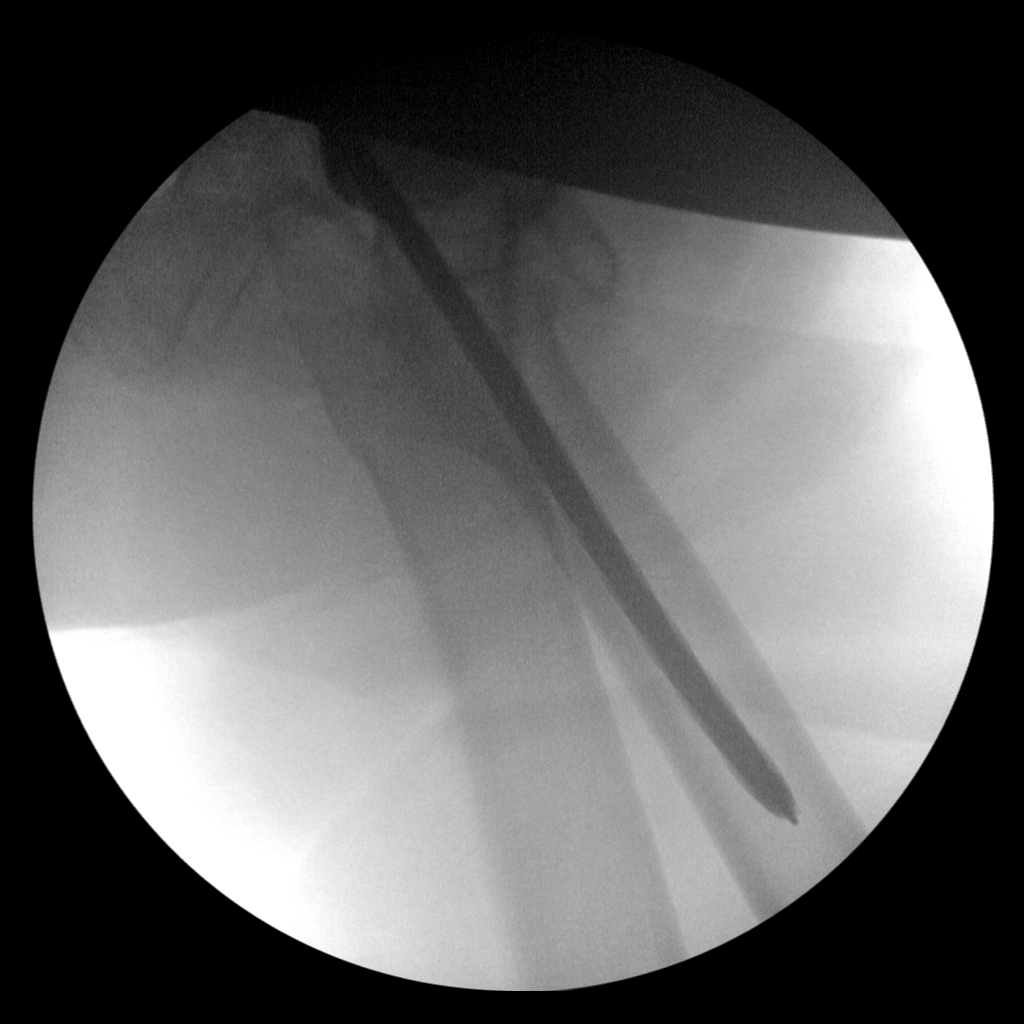
[im 3/5]
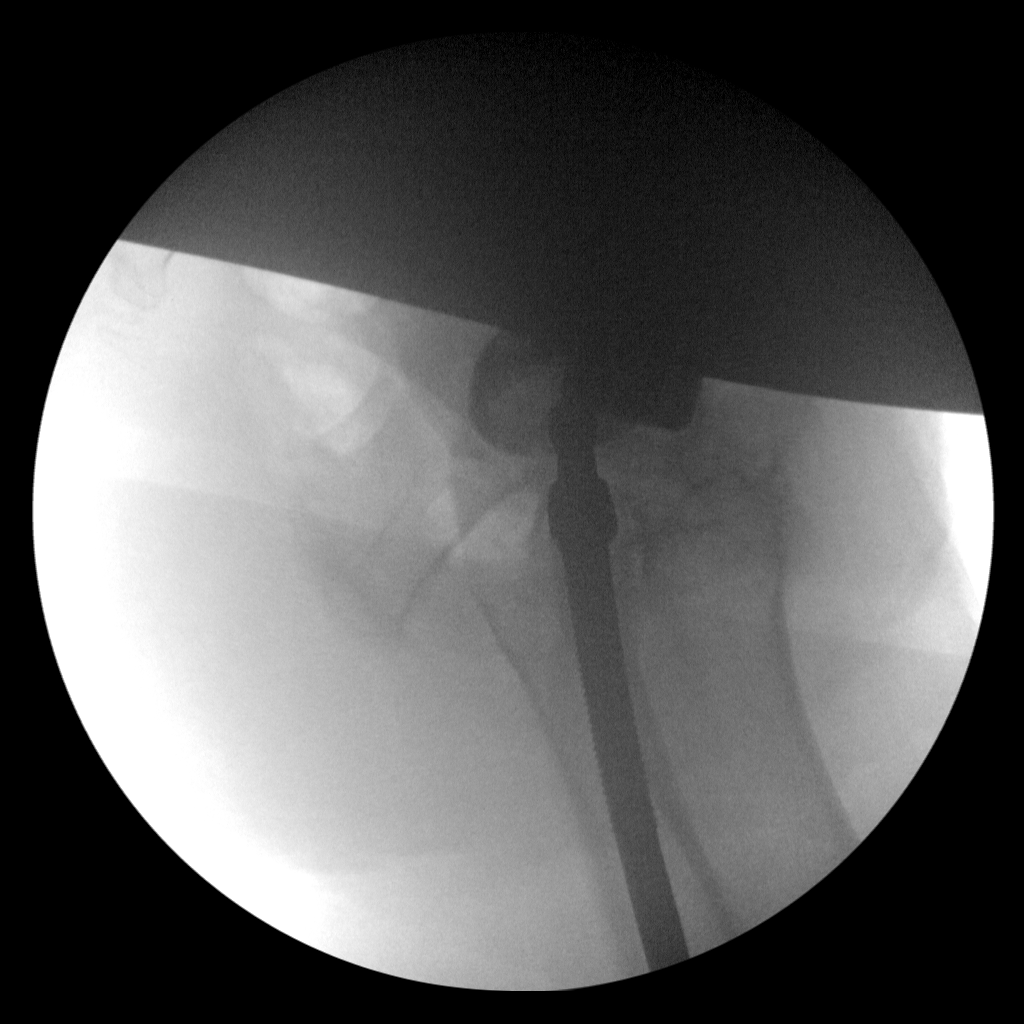
[im 4/5]
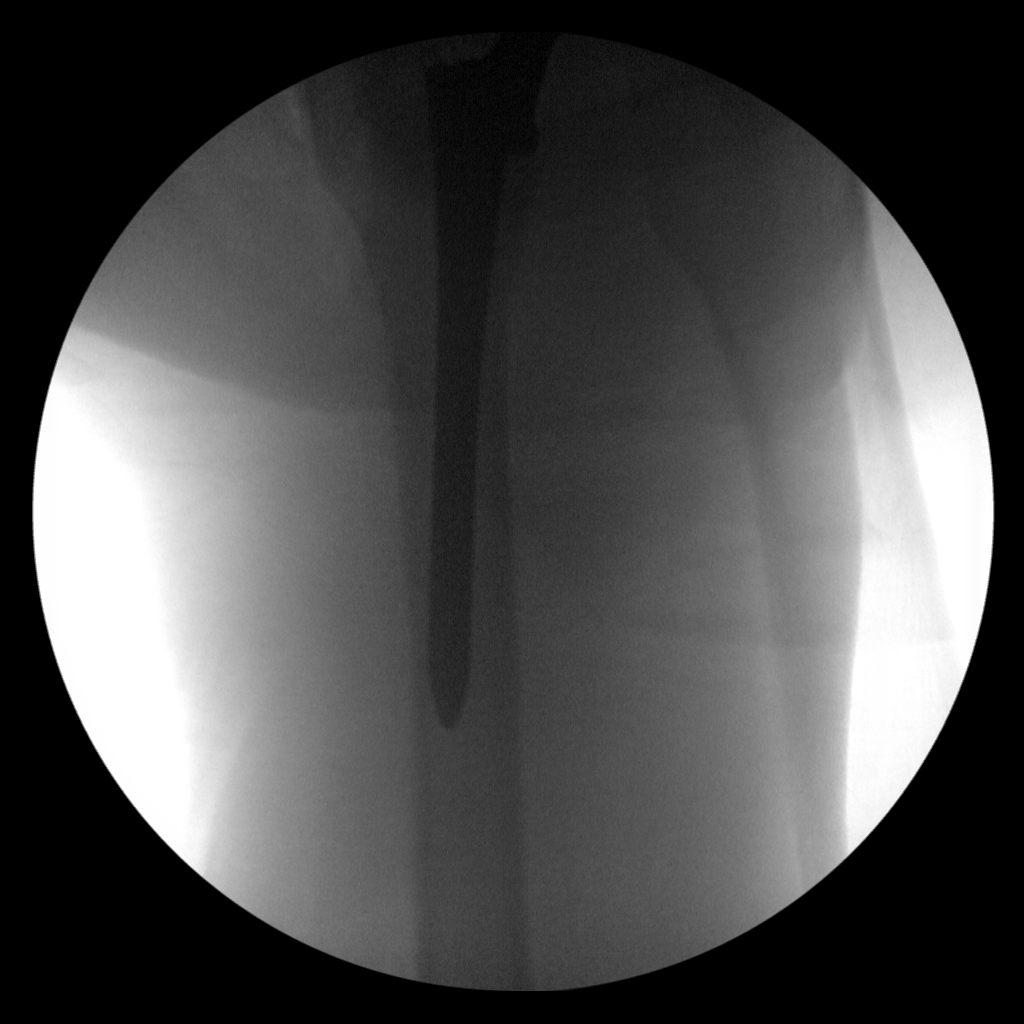
[im 5/5]
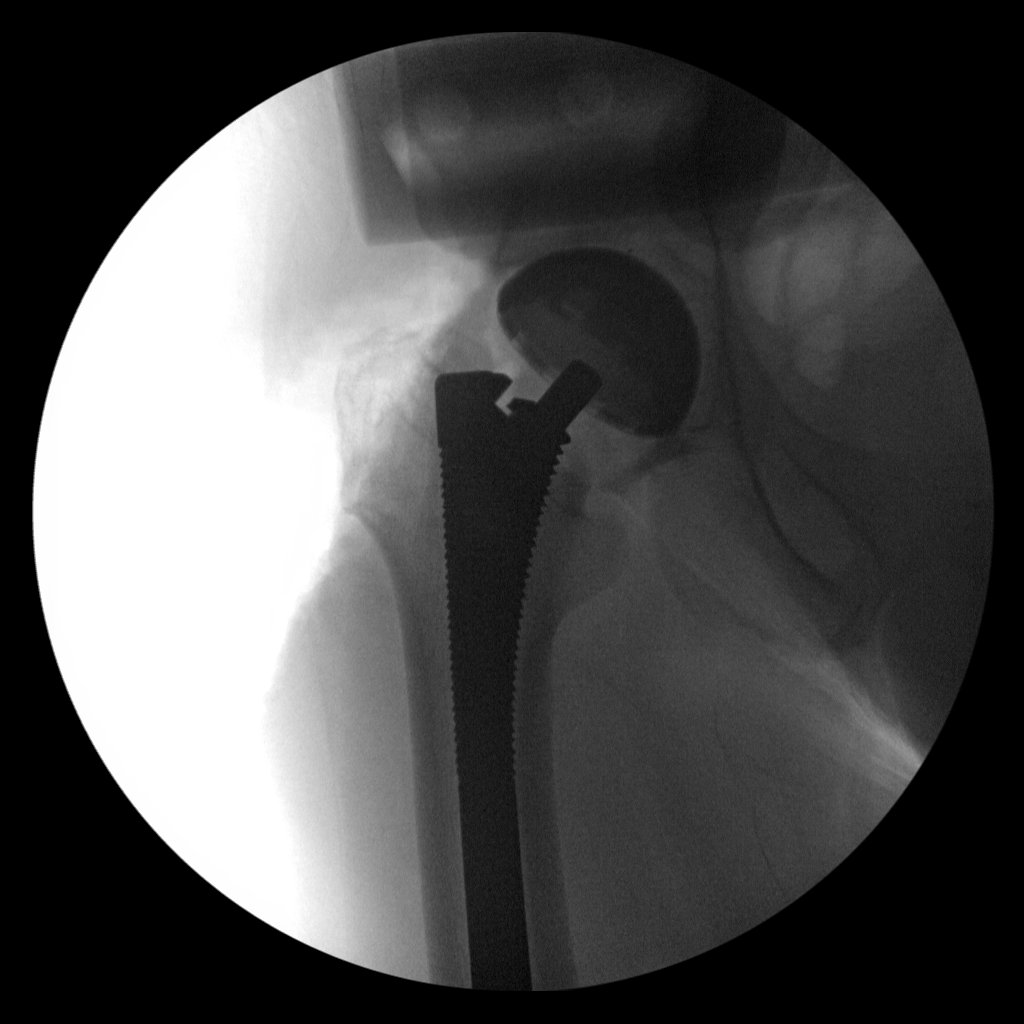

[5 of 5 positions shown; findings below may reference images not displayed]

FINDINGS: Progressive placement of a right hip prosthesis. No fracture or
dislocation seen.
IMPRESSION: Right hip prosthesis placement.

## 2022-02-17 IMAGING — US US EXTREM LOW VENOUS*R*
1 series · 13 of 24 positions shown · non-contrast
Comparison: None.

CLINICAL DATA: Right lower extremity pain for the past week.
History of right hip replacement on 10/14/2019. Evaluate for DVT.



[Series 1: us venous img lower uni right (dvt) · portal-venous · 13 of 35 slices shown]
[im 1/35]
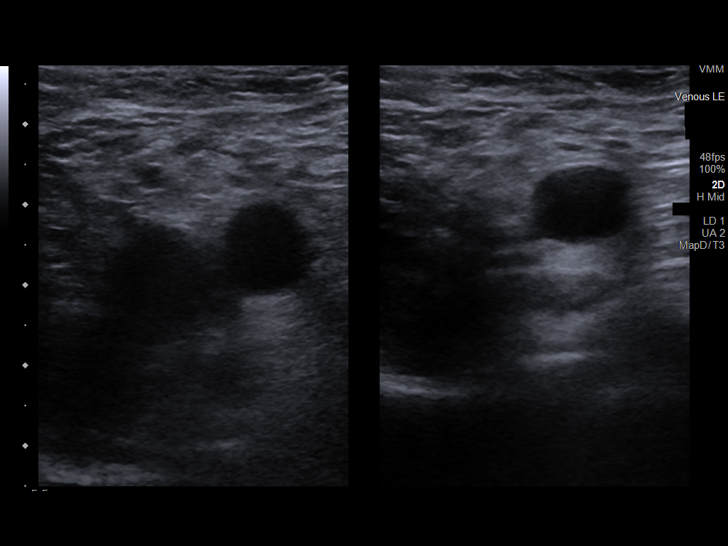
[im 3/35]
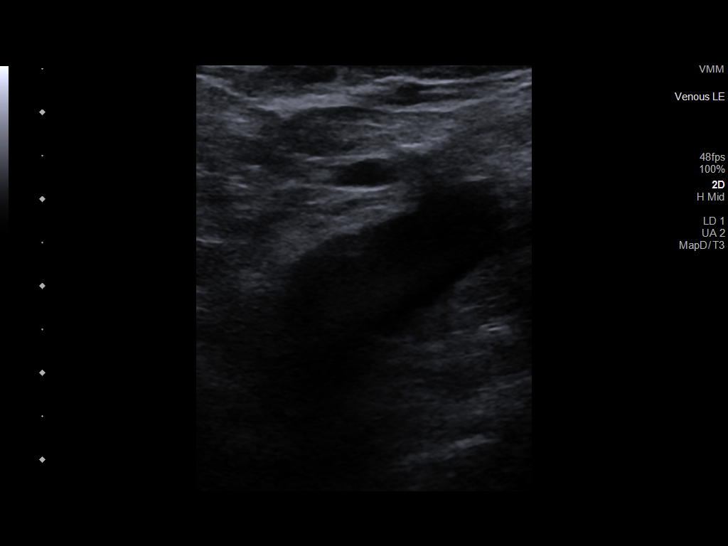
[im 6/35]
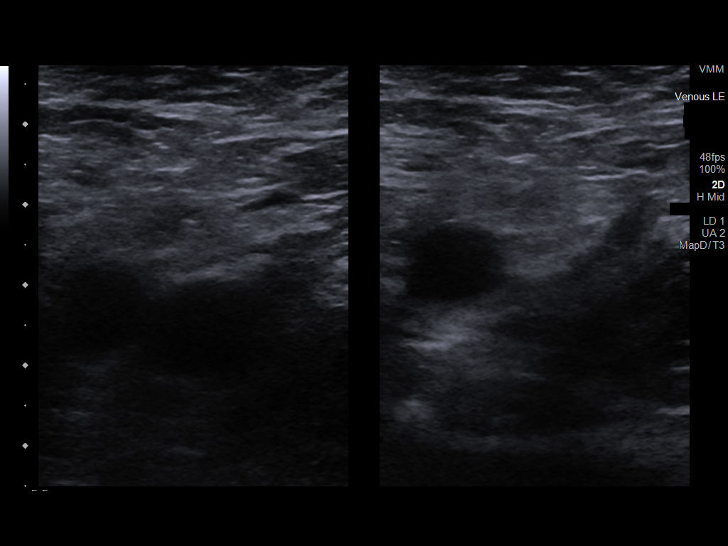
[im 9/35]
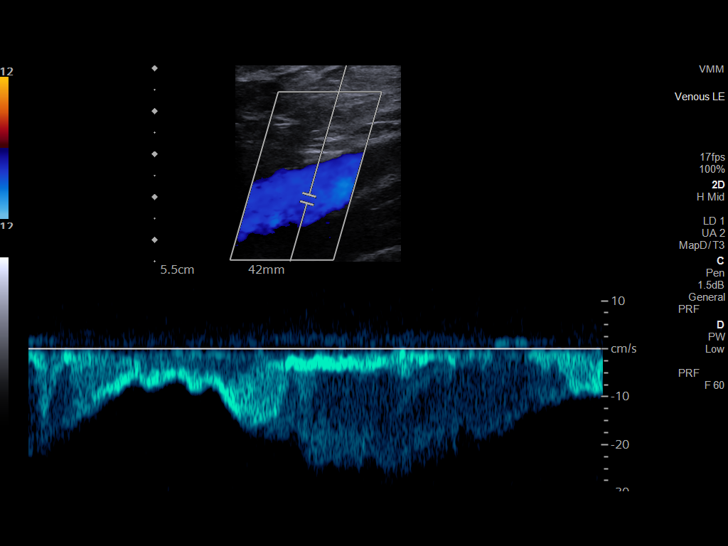
[im 12/35]
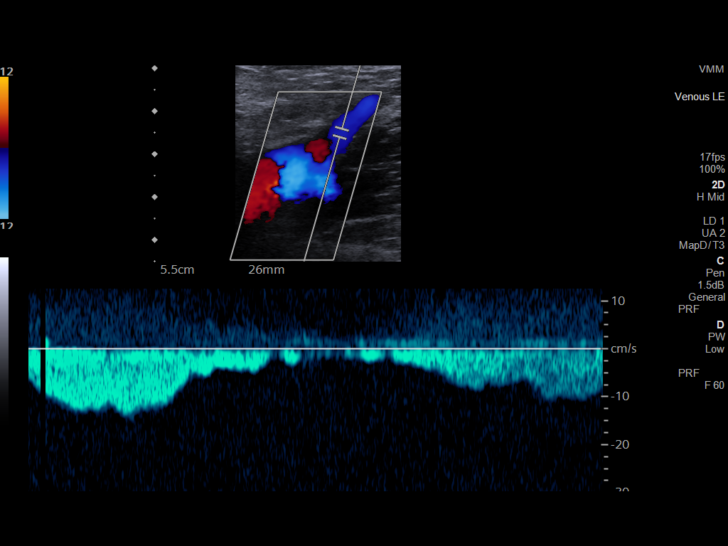
[im 15/35]
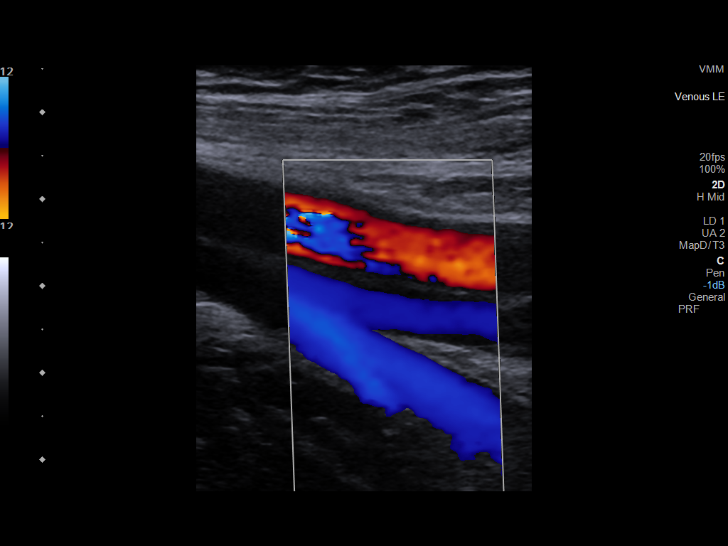
[im 18/35]
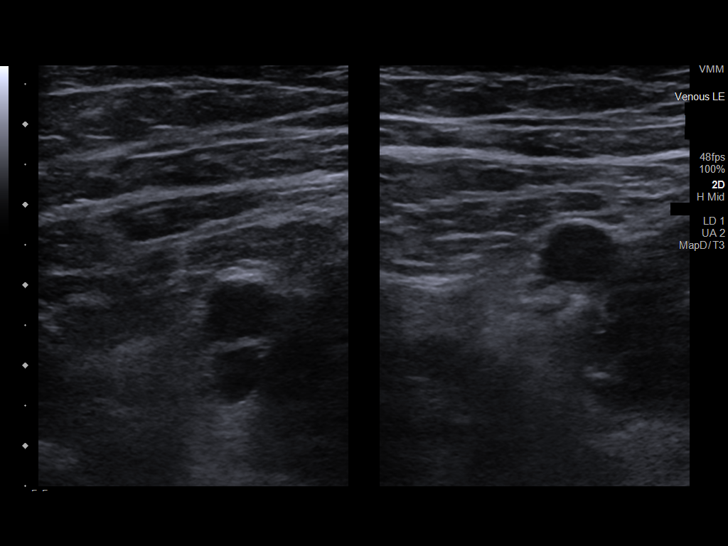
[im 20/35]
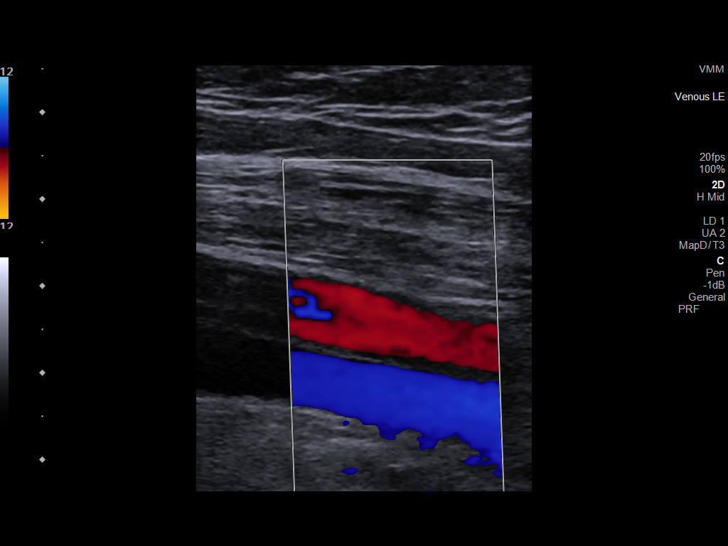
[im 23/35]
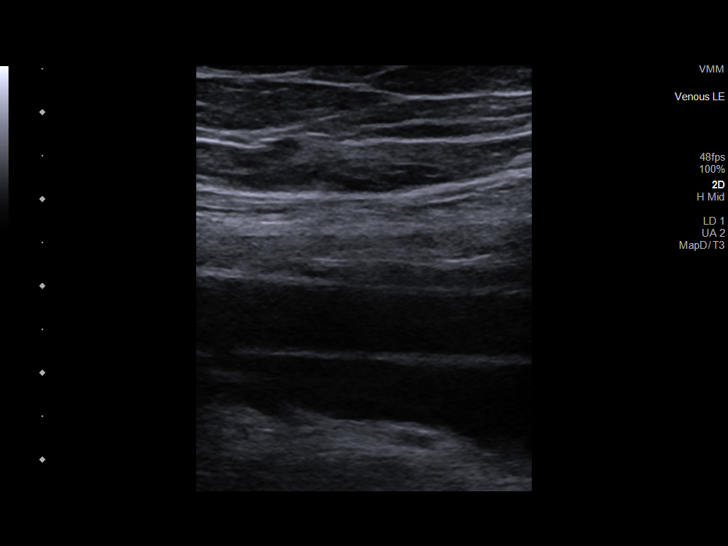
[im 26/35]
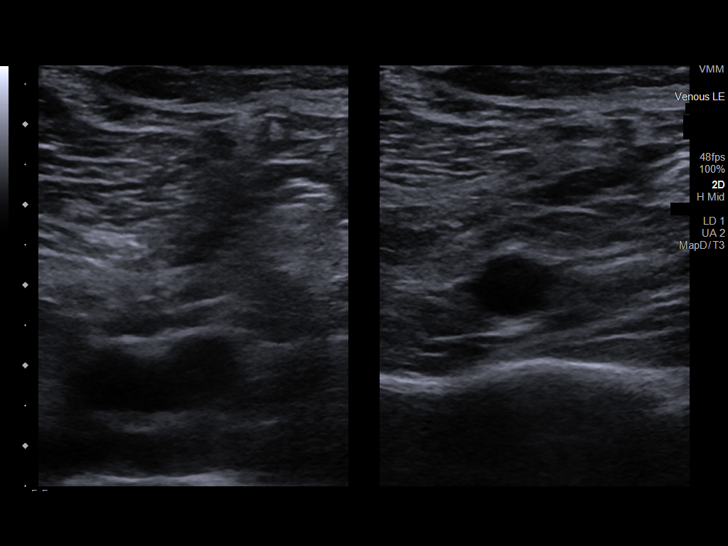
[im 29/35]
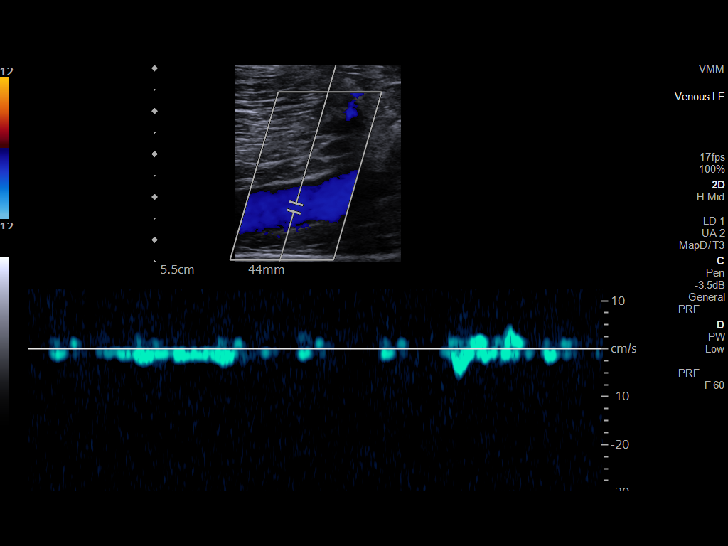
[im 32/35]
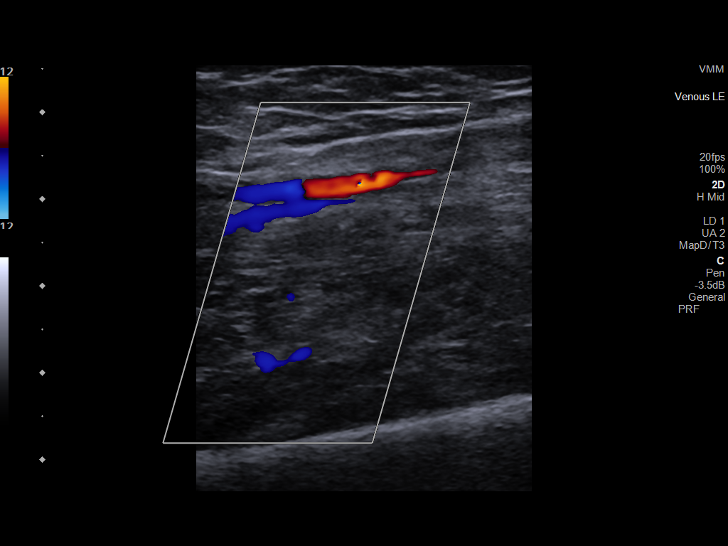
[im 35/35]
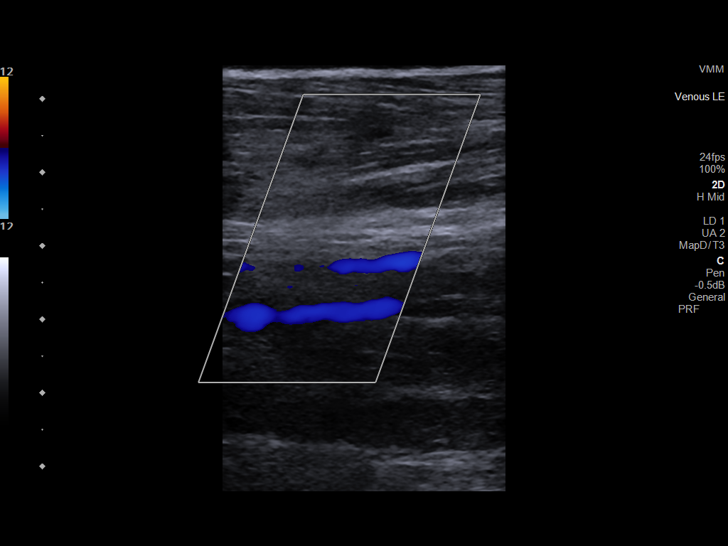

[13 of 24 positions shown; findings below may reference images not displayed]

FINDINGS: Contralateral Common Femoral Vein: Respiratory phasicity is normal
and symmetric with the symptomatic side. No evidence of thrombus.
Normal compressibility.

Common Femoral Vein: No evidence of thrombus. Normal
compressibility, respiratory phasicity and response to augmentation.

Saphenofemoral Junction: No evidence of thrombus. Normal
compressibility and flow on color Doppler imaging.

Profunda Femoral Vein: No evidence of thrombus. Normal
compressibility and flow on color Doppler imaging.

Femoral Vein: No evidence of thrombus. Normal compressibility,
respiratory phasicity and response to augmentation.

Popliteal Vein: No evidence of thrombus. Normal compressibility,
respiratory phasicity and response to augmentation.

Calf Veins: No evidence of thrombus. Normal compressibility and flow
on color Doppler imaging.

Superficial Great Saphenous Vein: No evidence of thrombus. Normal
compressibility.

Venous Reflux:  None.

Other Findings:  None.
IMPRESSION: No evidence of DVT within the right lower extremity.

## 2022-06-10 ENCOUNTER — Other Ambulatory Visit: Payer: Self-pay | Admitting: Otolaryngology

## 2022-06-10 DIAGNOSIS — J329 Chronic sinusitis, unspecified: Secondary | ICD-10-CM

## 2022-07-08 ENCOUNTER — Ambulatory Visit
Admission: RE | Admit: 2022-07-08 | Discharge: 2022-07-08 | Disposition: A | Discharge status: Home / Self Care | Payer: BC Managed Care – PPO | Source: Ambulatory Visit | Attending: Otolaryngology | Admitting: Otolaryngology

## 2022-07-08 ENCOUNTER — Other Ambulatory Visit: Payer: BC Managed Care – PPO

## 2022-07-08 DIAGNOSIS — J329 Chronic sinusitis, unspecified: Secondary | ICD-10-CM

## 2022-07-21 ENCOUNTER — Other Ambulatory Visit: Payer: Self-pay | Admitting: Otolaryngology

## 2022-07-24 ENCOUNTER — Encounter (HOSPITAL_BASED_OUTPATIENT_CLINIC_OR_DEPARTMENT_OTHER): Payer: Self-pay | Admitting: Otolaryngology

## 2022-07-24 NOTE — Progress Notes (Addendum)
Reviewed recent ED visit with Dr. Gifford Shave at [facility]. Patient will need to follow up an be seen by cardiologist before having procedure here at [facility]. LVM with Heather at Dr. Deeann Saint office.    Update: patient has an appointment with [facility] cardiology on 3/27. Pending clearance/workup and review of clearance with anesthesia patient can have surgery.

## 2022-07-24 NOTE — Progress Notes (Signed)
   07/24/22 1611  PAT Phone Screen  Is the patient taking a GLP-1 receptor agonist? No  Do You Have Diabetes? No  Do You Have Hypertension? Yes  Have You Ever Been to the ER for Asthma? No  Have You Taken Oral Steroids in the Past 3 Months? No  Do you Take Phenteramine or any Other Diet Drugs? No  Recent  Lab Work, EKG, CXR? No  Do you have a history of heart problems? (S)  Yes  Cardiologist Name (S)  [facility] cardiology  Have you ever had tests on your heart? Yes  What cardiac tests were performed? (S)  Other (comment)  What date/year were cardiac tests completed? (S)  ED visits for SVT- workup and clearance appointment scheduled for 3/27  Results viewable: (S)  Requested  Any Recent Hospitalizations? No  Height 6\' 1"  (1.854 m)  Weight 99.8 kg  Pat Appointment Scheduled No (DOS EKG if not done at cardiologist on 3/27)

## 2022-08-01 NOTE — Anesthesia Preprocedure Evaluation (Signed)
Anesthesia Evaluation  Patient identified by MRN, date of birth, ID band Patient awake    Reviewed: Allergy & Precautions, NPO status , Patient's Chart, lab work & pertinent test results  History of Anesthesia Complications Negative for: history of anesthetic complications  Airway Mallampati: III  TM Distance: >3 FB Neck ROM: Full    Dental  (+) Dental Advisory Given   Pulmonary neg shortness of breath, neg sleep apnea, neg COPD, neg recent URI, former smoker   Pulmonary exam normal breath sounds clear to auscultation       Cardiovascular hypertension (diltiazem, metoprolol), Pt. on home beta blockers and Pt. on medications (-) angina (-) Past MI, (-) Cardiac Stents and (-) CABG + dysrhythmias Supra Ventricular Tachycardia  Rhythm:Regular Rate:Normal  TTE 08/19/2019: Summary   1. The left ventricle is normal in size with normal wall thickness.    2. The left ventricular systolic function is normal, LVEF is visually  estimated at > 55%.    3. The right ventricle is upper normal in size, with normal systolic  function.   4. The right atrium is mildly dilated  in size.    5. Borderline dilated aortic root (3.8 cm).  Ascending aorta appears normal  size where seen.     Neuro/Psych negative neurological ROS     GI/Hepatic negative GI ROS, Neg liver ROS,,,  Endo/Other  negative endocrine ROS    Renal/GU negative Renal ROS     Musculoskeletal  (+) Arthritis ,    Abdominal   Peds  Hematology negative hematology ROS (+)   Anesthesia Other Findings Seen in ED 07/06/2022 for SVT that converted to NSR with adenosine. Seen by cardiology 07/30/2022 and cleared for surgery. Last metoprolol prn was about 2 weeks ago.  Reproductive/Obstetrics                             Anesthesia Physical Anesthesia Plan  ASA: 2  Anesthesia Plan: General   Post-op Pain Management: Tylenol PO (pre-op)*   Induction:  Intravenous  PONV Risk Score and Plan: 2 and Ondansetron, Dexamethasone and Treatment may vary due to age or medical condition  Airway Management Planned: Oral ETT  Additional Equipment:   Intra-op Plan:   Post-operative Plan: Extubation in OR  Informed Consent: I have reviewed the patients History and Physical, chart, labs and discussed the procedure including the risks, benefits and alternatives for the proposed anesthesia with the patient or authorized representative who has indicated his/her understanding and acceptance.     Dental advisory given  Plan Discussed with: CRNA and Anesthesiologist  Anesthesia Plan Comments: (Risks of general anesthesia discussed including, but not limited to, sore throat, hoarse voice, chipped/damaged teeth, injury to vocal cords, nausea and vomiting, allergic reactions, lung infection, heart attack, stroke, and death. All questions answered. )       Anesthesia Quick Evaluation

## 2022-08-04 ENCOUNTER — Ambulatory Visit (HOSPITAL_BASED_OUTPATIENT_CLINIC_OR_DEPARTMENT_OTHER)
Admission: RE | Admit: 2022-08-04 | Discharge: 2022-08-04 | Disposition: A | Discharge status: Home / Self Care | Payer: BC Managed Care – PPO | Attending: Otolaryngology | Admitting: Otolaryngology

## 2022-08-04 ENCOUNTER — Ambulatory Visit (HOSPITAL_BASED_OUTPATIENT_CLINIC_OR_DEPARTMENT_OTHER): Payer: BC Managed Care – PPO | Admitting: Anesthesiology

## 2022-08-04 ENCOUNTER — Encounter (HOSPITAL_BASED_OUTPATIENT_CLINIC_OR_DEPARTMENT_OTHER)
Admission: RE | Disposition: A | Discharge status: Home / Self Care | Payer: Self-pay | Source: Home / Self Care | Attending: Otolaryngology

## 2022-08-04 ENCOUNTER — Encounter (HOSPITAL_BASED_OUTPATIENT_CLINIC_OR_DEPARTMENT_OTHER): Payer: Self-pay | Admitting: Otolaryngology

## 2022-08-04 ENCOUNTER — Other Ambulatory Visit: Payer: Self-pay

## 2022-08-04 DIAGNOSIS — J310 Chronic rhinitis: Secondary | ICD-10-CM | POA: Insufficient documentation

## 2022-08-04 DIAGNOSIS — I10 Essential (primary) hypertension: Secondary | ICD-10-CM | POA: Diagnosis not present

## 2022-08-04 DIAGNOSIS — Z01818 Encounter for other preprocedural examination: Secondary | ICD-10-CM

## 2022-08-04 DIAGNOSIS — J342 Deviated nasal septum: Secondary | ICD-10-CM | POA: Insufficient documentation

## 2022-08-04 DIAGNOSIS — J343 Hypertrophy of nasal turbinates: Secondary | ICD-10-CM | POA: Insufficient documentation

## 2022-08-04 DIAGNOSIS — Z79899 Other long term (current) drug therapy: Secondary | ICD-10-CM | POA: Insufficient documentation

## 2022-08-04 DIAGNOSIS — J3489 Other specified disorders of nose and nasal sinuses: Secondary | ICD-10-CM | POA: Insufficient documentation

## 2022-08-04 DIAGNOSIS — I471 Supraventricular tachycardia, unspecified: Secondary | ICD-10-CM | POA: Diagnosis not present

## 2022-08-04 HISTORY — PX: NASAL SEPTOPLASTY W/ TURBINOPLASTY: SHX2070

## 2022-08-04 SURGERY — SEPTOPLASTY, NOSE, WITH NASAL TURBINATE REDUCTION
Anesthesia: General | Site: Nose | Laterality: Bilateral

## 2022-08-04 MED ORDER — OXYCODONE HCL 5 MG PO TABS
ORAL_TABLET | ORAL | Status: AC
  Filled 2022-08-04: qty 1

## 2022-08-04 MED ORDER — PHENYLEPHRINE 80 MCG/ML (10ML) SYRINGE FOR IV PUSH (FOR BLOOD PRESSURE SUPPORT)
PREFILLED_SYRINGE | INTRAVENOUS | Status: AC
  Filled 2022-08-04: qty 10

## 2022-08-04 MED ORDER — ACETAMINOPHEN 500 MG PO TABS
ORAL_TABLET | ORAL | Status: AC
  Filled 2022-08-04: qty 2

## 2022-08-04 MED ORDER — CEFAZOLIN SODIUM-DEXTROSE 2-3 GM-%(50ML) IV SOLR
INTRAVENOUS | Status: DC | PRN
  Administered 2022-08-04: 2 g via INTRAVENOUS

## 2022-08-04 MED ORDER — PHENYLEPHRINE HCL (PRESSORS) 10 MG/ML IV SOLN
INTRAVENOUS | Status: DC | PRN
  Administered 2022-08-04: 180 ug via INTRAVENOUS
  Administered 2022-08-04 (×2): 80 ug via INTRAVENOUS

## 2022-08-04 MED ORDER — CEFAZOLIN SODIUM-DEXTROSE 2-4 GM/100ML-% IV SOLN
INTRAVENOUS | Status: AC
  Filled 2022-08-04: qty 100

## 2022-08-04 MED ORDER — ROCURONIUM BROMIDE 10 MG/ML (PF) SYRINGE
PREFILLED_SYRINGE | INTRAVENOUS | Status: AC
  Filled 2022-08-04: qty 10

## 2022-08-04 MED ORDER — FENTANYL CITRATE (PF) 100 MCG/2ML IJ SOLN
25.0000 ug | INTRAMUSCULAR | Status: DC | PRN
  Administered 2022-08-04 (×3): 50 ug via INTRAVENOUS

## 2022-08-04 MED ORDER — PROPOFOL 10 MG/ML IV BOLUS
INTRAVENOUS | Status: AC
  Filled 2022-08-04: qty 20

## 2022-08-04 MED ORDER — OXYCODONE HCL 5 MG PO TABS
5.0000 mg | ORAL_TABLET | Freq: Once | ORAL | Status: AC | PRN
  Administered 2022-08-04: 5 mg via ORAL

## 2022-08-04 MED ORDER — MIDAZOLAM HCL 2 MG/2ML IJ SOLN
INTRAMUSCULAR | Status: AC
  Filled 2022-08-04: qty 2

## 2022-08-04 MED ORDER — MIDAZOLAM HCL 5 MG/5ML IJ SOLN
INTRAMUSCULAR | Status: DC | PRN
  Administered 2022-08-04: 2 mg via INTRAVENOUS

## 2022-08-04 MED ORDER — ACETAMINOPHEN 500 MG PO TABS
1000.0000 mg | ORAL_TABLET | Freq: Once | ORAL | Status: AC
  Administered 2022-08-04: 1000 mg via ORAL

## 2022-08-04 MED ORDER — FENTANYL CITRATE (PF) 100 MCG/2ML IJ SOLN
INTRAMUSCULAR | Status: AC
  Filled 2022-08-04: qty 2

## 2022-08-04 MED ORDER — SUGAMMADEX SODIUM 200 MG/2ML IV SOLN
INTRAVENOUS | Status: DC | PRN
  Administered 2022-08-04: 250 mg via INTRAVENOUS

## 2022-08-04 MED ORDER — LIDOCAINE 2% (20 MG/ML) 5 ML SYRINGE
INTRAMUSCULAR | Status: AC
  Filled 2022-08-04: qty 5

## 2022-08-04 MED ORDER — OXYCODONE HCL 5 MG/5ML PO SOLN: 5.0000 mg | Freq: Once | ORAL | Status: AC | PRN

## 2022-08-04 MED ORDER — ROCURONIUM BROMIDE 100 MG/10ML IV SOLN
INTRAVENOUS | Status: DC | PRN
  Administered 2022-08-04: 50 mg via INTRAVENOUS

## 2022-08-04 MED ORDER — OXYCODONE-ACETAMINOPHEN 5-325 MG PO TABS: 1.0000 | ORAL_TABLET | ORAL | 0 refills | Status: AC | PRN

## 2022-08-04 MED ORDER — OXYMETAZOLINE HCL 0.05 % NA SOLN
NASAL | Status: DC | PRN
  Administered 2022-08-04: 1 via TOPICAL

## 2022-08-04 MED ORDER — ONDANSETRON HCL 4 MG/2ML IJ SOLN
INTRAMUSCULAR | Status: AC
  Filled 2022-08-04: qty 2

## 2022-08-04 MED ORDER — MUPIROCIN 2 % EX OINT
TOPICAL_OINTMENT | CUTANEOUS | Status: DC | PRN
  Administered 2022-08-04: 1 via NASAL

## 2022-08-04 MED ORDER — FENTANYL CITRATE (PF) 100 MCG/2ML IJ SOLN
INTRAMUSCULAR | Status: DC | PRN
  Administered 2022-08-04: 100 ug via INTRAVENOUS

## 2022-08-04 MED ORDER — DEXAMETHASONE SODIUM PHOSPHATE 10 MG/ML IJ SOLN
INTRAMUSCULAR | Status: AC
  Filled 2022-08-04: qty 1

## 2022-08-04 MED ORDER — LIDOCAINE 2% (20 MG/ML) 5 ML SYRINGE
INTRAMUSCULAR | Status: DC | PRN
  Administered 2022-08-04: 100 mg via INTRAVENOUS

## 2022-08-04 MED ORDER — LACTATED RINGERS IV SOLN: INTRAVENOUS | Status: DC

## 2022-08-04 MED ORDER — AMISULPRIDE (ANTIEMETIC) 5 MG/2ML IV SOLN: 10.0000 mg | Freq: Once | INTRAVENOUS | Status: DC | PRN

## 2022-08-04 MED ORDER — ONDANSETRON HCL 4 MG/2ML IJ SOLN
INTRAMUSCULAR | Status: DC | PRN
  Administered 2022-08-04: 4 mg via INTRAVENOUS

## 2022-08-04 MED ORDER — DEXAMETHASONE SODIUM PHOSPHATE 10 MG/ML IJ SOLN
INTRAMUSCULAR | Status: DC | PRN
  Administered 2022-08-04: 10 mg via INTRAVENOUS

## 2022-08-04 MED ORDER — AMOXICILLIN 875 MG PO TABS: 875.0000 mg | ORAL_TABLET | Freq: Two times a day (BID) | ORAL | 0 refills | Status: AC

## 2022-08-04 MED ORDER — PROPOFOL 10 MG/ML IV BOLUS
INTRAVENOUS | Status: DC | PRN
  Administered 2022-08-04: 200 mg via INTRAVENOUS

## 2022-08-04 MED ORDER — LIDOCAINE-EPINEPHRINE 1 %-1:100000 IJ SOLN
INTRAMUSCULAR | Status: DC | PRN
  Administered 2022-08-04: 5.5 mL

## 2022-08-04 SURGICAL SUPPLY — 30 items
ATTRACTOMAT 16X20 MAGNETIC DRP (DRAPES) IMPLANT
CANISTER SUCT 1200ML W/VALVE (MISCELLANEOUS) ×1 IMPLANT
COAGULATOR SUCT 8FR VV (MISCELLANEOUS) ×1 IMPLANT
DEFOGGER MIRROR 1QT (MISCELLANEOUS) ×1 IMPLANT
DRSG NASOPORE 8CM (GAUZE/BANDAGES/DRESSINGS) IMPLANT
DRSG TELFA 3X8 NADH STRL (GAUZE/BANDAGES/DRESSINGS) IMPLANT
ELECT REM PT RETURN 9FT ADLT (ELECTROSURGICAL) ×1
ELECTRODE REM PT RTRN 9FT ADLT (ELECTROSURGICAL) ×1 IMPLANT
GAUZE SPONGE 2X2 STRL 8-PLY (GAUZE/BANDAGES/DRESSINGS) ×1 IMPLANT
GLOVE BIO SURGEON STRL SZ7.5 (GLOVE) ×1 IMPLANT
GOWN STRL REUS W/ TWL LRG LVL3 (GOWN DISPOSABLE) ×2 IMPLANT
GOWN STRL REUS W/TWL LRG LVL3 (GOWN DISPOSABLE) ×2
NDL HYPO 25X1 1.5 SAFETY (NEEDLE) ×1 IMPLANT
NEEDLE HYPO 25X1 1.5 SAFETY (NEEDLE) ×1 IMPLANT
NS IRRIG 1000ML POUR BTL (IV SOLUTION) ×1 IMPLANT
PACK BASIN DAY SURGERY FS (CUSTOM PROCEDURE TRAY) ×1 IMPLANT
PACK ENT DAY SURGERY (CUSTOM PROCEDURE TRAY) ×1 IMPLANT
SLEEVE SCD COMPRESS KNEE MED (STOCKING) IMPLANT
SPIKE FLUID TRANSFER (MISCELLANEOUS) IMPLANT
SPLINT NASAL AIRWAY SILICONE (MISCELLANEOUS) ×1 IMPLANT
SPONGE NEURO XRAY DETECT 1X3 (DISPOSABLE) ×1 IMPLANT
SUT CHROMIC 4 0 P 3 18 (SUTURE) ×1 IMPLANT
SUT PLAIN 4 0 ~~LOC~~ 1 (SUTURE) ×1 IMPLANT
SUT PROLENE 3 0 PS 2 (SUTURE) ×1 IMPLANT
SUT VIC AB 4-0 P-3 18XBRD (SUTURE) IMPLANT
SUT VIC AB 4-0 P3 18 (SUTURE)
TOWEL GREEN STERILE FF (TOWEL DISPOSABLE) ×1 IMPLANT
TUBE SALEM SUMP 12F (TUBING) IMPLANT
TUBE SALEM SUMP 16F (TUBING) ×1 IMPLANT
YANKAUER SUCT BULB TIP NO VENT (SUCTIONS) ×1 IMPLANT

## 2022-08-04 NOTE — Op Note (Signed)
DATE OF PROCEDURE: 08/04/2022  OPERATIVE REPORT   SURGEON: Leta Baptist, MD   PREOPERATIVE DIAGNOSES:  1. Severe nasal septal deviation.  2. Bilateral inferior turbinate hypertrophy.  3. Chronic nasal obstruction.  POSTOPERATIVE DIAGNOSES:  1. Severe nasal septal deviation.  2. Bilateral inferior turbinate hypertrophy.  3. Chronic nasal obstruction.  PROCEDURE PERFORMED:  1. Septoplasty.  2. Bilateral partial inferior turbinate resection.   ANESTHESIA: General endotracheal tube anesthesia.   COMPLICATIONS: None.   ESTIMATED BLOOD LOSS: 50 mL.   INDICATION FOR PROCEDURE: Javier Taylor is a 52 y.o. male with a history of chronic nasal obstruction. The patient was treated with antihistamine, decongestant, and steroid nasal sprays. However, the patient continued to be symptomatic. On examination, the patient was noted to have bilateral severe inferior turbinate hypertrophy and significant nasal septal deviation, causing significant nasal obstruction. Based on the above findings, the decision was made for the patient to undergo the above-stated procedures. The risks, benefits, alternatives, and details of the procedures were discussed with the patient. Questions were invited and answered. Informed consent was obtained.   DESCRIPTION OF PROCEDURE: The patient was taken to the operating room and placed supine on the operating table. General endotracheal tube anesthesia was administered by the anesthesiologist. The patient was positioned, and prepped and draped in the standard fashion for nasal surgery. Pledgets soaked with Afrin were placed in both nasal cavities for decongestion. The pledgets were subsequently removed.   Examination of the nasal cavity revealed a severe nasal septal deviation. 1% lidocaine with 1:100,000 epinephrine was injected onto the nasal septum bilaterally. A hemitransfixion incision was made on the left side. The mucosal flap was carefully elevated on the left side. A  cartilaginous incision was made 1 cm superior to the caudal margin of the nasal septum. Mucosal flap was also elevated on the right side in the similar fashion. It should be noted that due to the severe septal deviation, the deviated portion of the cartilaginous and bony septum had to be removed in piecemeal fashion. Once the deviated portions were removed, a straight midline septum was achieved. The septum was then quilted with 4-0 plain gut sutures. The hemitransfixion incision was closed with interrupted 4-0 chromic sutures.   The inferior one half of both hypertrophied inferior turbinate was crossclamped with a Kelly clamp. The inferior one half of each inferior turbinate was then resected with a pair of cross cutting scissors. Hemostasis was achieved with a suction cautery device.  Doyle splints were applied to the nasal septum.  The care of the patient was turned over to the anesthesiologist. The patient was awakened from anesthesia without difficulty. The patient was extubated and transferred to the recovery room in good condition.   OPERATIVE FINDINGS: Severe nasal septal deviation and bilateral inferior turbinate hypertrophy.   SPECIMEN: None.   FOLLOWUP CARE: The patient be discharged home once he is awake and alert. The patient will follow up in my office in 3 days for splint removal.   Shawnda Mauney Raynelle Bring, MD

## 2022-08-04 NOTE — H&P (Signed)
Cc: Chronic nasal obstruction  HPI: The patient is a 52 year old male who returns today for his follow-up evaluation.  The patient was last seen 1 month ago.  At that time, he was complaining of chronic nasal obstruction and recurrent sinusitis.  He was noted to have nasal mucosal congestion, nasal septal deviation, and bilateral inferior turbinate hypertrophy.  He was treated with Nasacort nasal spray daily.  He subsequently underwent a sinus CT scan.  The CT showed no significant acute or chronic sinusitis.  However, significant nasal septal deviation and bilateral inferior turbinate hypertrophy were noted.  The patient returns today complaining of persistent nasal obstruction.  Currently he denies any facial pain, fever, or visual change.  Exam: General: Communicates without difficulty, well nourished, no acute distress. Head: Normocephalic, no evidence injury, no tenderness, facial buttresses intact without stepoff. Face/sinus: No tenderness to palpation and percussion. Facial movement is normal and symmetric. Eyes: PERRL, EOMI. No scleral icterus, conjunctivae clear. Neuro: CN II exam reveals vision grossly intact.  No nystagmus at any point of gaze. Ears: Auricles well formed without lesions.  Ear canals are intact without mass or lesion.  No erythema or edema is appreciated.  The TMs are intact without fluid. Nose: External evaluation reveals normal support and skin without lesions.  Dorsum is intact.  Anterior rhinoscopy reveals congested mucosa over anterior aspect of inferior turbinates and intact septum.  No purulence noted. Oral:  Oral cavity and oropharynx are intact, symmetric, without erythema or edema.  Mucosa is moist without lesions. Neck: Full range of motion without pain.  There is no significant lymphadenopathy.  No masses palpable.  Thyroid bed within normal limits to palpation.  Parotid glands and submandibular glands equal bilaterally without mass.  Trachea is midline. Neuro:  CN 2-12  grossly intact. Gait normal. A Flexible scope was inserted into the right nasal cavity.  Endoscopy of the interior nasal cavity, superior, inferior, and middle meatus was performed. The sphenoid-ethmoid recess was examined. Edematous mucosa was noted.  No polyp, mass, or lesion was appreciated. Nasal septal deviation noted.  Olfactory cleft was clear.  Nasopharynx was clear.  Turbinates were hypertrophied but without mass. The procedure was repeated on the contralateral side with similar findings.   Assessment: 1.  Chronic rhinitis with nasal mucosal congestion, nasal septal deviation, and bilateral inferior turbinate hypertrophy. 2.  No significant acute or chronic sinusitis was noted on his recent CT scan.  Plan: 1.  The nasal endoscopy findings and the CT images are reviewed with the patient. 2.  Continue with Nasacort nasal spray daily.  The importance of consistent daily use is discussed. 3.  In light of his persistent neck symptoms, he may benefit from surgical intervention with septoplasty and bilateral turbinate reduction.  The risk, benefits, and details of the procedures are reviewed. 4.  The patient would like to proceed with the procedures.

## 2022-08-04 NOTE — Anesthesia Postprocedure Evaluation (Signed)
Anesthesia Post Note  Patient: Javier Taylor  Procedure(s) Performed: NASAL SEPTOPLASTY WITH TURBINATE REDUCTION (Bilateral: Nose)     Patient location during evaluation: PACU Anesthesia Type: General Level of consciousness: awake Pain management: pain level controlled Vital Signs Assessment: post-procedure vital signs reviewed and stable Respiratory status: spontaneous breathing, nonlabored ventilation and respiratory function stable Cardiovascular status: blood pressure returned to baseline and stable Postop Assessment: no apparent nausea or vomiting Anesthetic complications: no   No notable events documented.  Last Vitals:  Vitals:   08/04/22 1038 08/04/22 1100  BP: (!) 144/104 (!) 140/99  Pulse: 72 82  Resp: 11   Temp:  37 C  SpO2: 95% 97%    Last Pain:  Vitals:   08/04/22 1100  TempSrc:   PainSc: 7                  Nilda Simmer

## 2022-08-04 NOTE — Anesthesia Procedure Notes (Signed)
Procedure Name: Intubation Date/Time: 08/04/2022 9:06 AM  Performed by: Lavonia Dana, CRNAPre-anesthesia Checklist: Patient identified, Emergency Drugs available, Suction available and Patient being monitored Patient Re-evaluated:Patient Re-evaluated prior to induction Oxygen Delivery Method: Circle system utilized Preoxygenation: Pre-oxygenation with 100% oxygen Induction Type: IV induction Ventilation: Oral airway inserted - appropriate to patient size and Two handed mask ventilation required Laryngoscope Size: Mac and 4 Grade View: Grade I Tube type: Oral Tube size: 7.5 mm Number of attempts: 1 Airway Equipment and Method: Stylet, Oral airway and Bite block Placement Confirmation: ETT inserted through vocal cords under direct vision, positive ETCO2 and breath sounds checked- equal and bilateral Secured at: 25 cm Tube secured with: Tape Dental Injury: Teeth and Oropharynx as per pre-operative assessment

## 2022-08-04 NOTE — Discharge Instructions (Addendum)

## 2022-08-04 NOTE — Transfer of Care (Signed)
Immediate Anesthesia Transfer of Care Note  Patient: JAXZEN ENTIN  Procedure(s) Performed: NASAL SEPTOPLASTY WITH TURBINATE REDUCTION (Bilateral: Nose)  Patient Location: PACU  Anesthesia Type:General  Level of Consciousness: drowsy  Airway & Oxygen Therapy: Patient Spontanous Breathing and Patient connected to face mask oxygen  Post-op Assessment: Report given to RN and Post -op Vital signs reviewed and stable  Post vital signs: Reviewed and stable  Last Vitals:  Vitals Value Taken Time  BP 135/99 08/04/22 1004  Temp 36.7 C 08/04/22 1004  Pulse 83 08/04/22 1005  Resp 11 08/04/22 1005  SpO2 96 % 08/04/22 1005  Vitals shown include unvalidated device data.  Last Pain:  Vitals:   08/04/22 0730  TempSrc: Oral  PainSc: 0-No pain         Complications: No notable events documented.

## 2022-08-05 ENCOUNTER — Encounter (HOSPITAL_BASED_OUTPATIENT_CLINIC_OR_DEPARTMENT_OTHER): Payer: Self-pay | Admitting: Otolaryngology

## 2022-12-05 ENCOUNTER — Other Ambulatory Visit (HOSPITAL_COMMUNITY): Payer: Self-pay | Admitting: Urology

## 2022-12-05 DIAGNOSIS — R972 Elevated prostate specific antigen [PSA]: Secondary | ICD-10-CM

## 2022-12-18 ENCOUNTER — Ambulatory Visit (HOSPITAL_COMMUNITY)
Admission: RE | Admit: 2022-12-18 | Discharge: 2022-12-18 | Disposition: A | Discharge status: Home / Self Care | Payer: BC Managed Care – PPO | Source: Ambulatory Visit | Attending: Urology | Admitting: Urology

## 2022-12-18 ENCOUNTER — Ambulatory Visit (HOSPITAL_COMMUNITY): Payer: BC Managed Care – PPO

## 2022-12-18 ENCOUNTER — Encounter (HOSPITAL_COMMUNITY): Payer: Self-pay

## 2022-12-18 DIAGNOSIS — R972 Elevated prostate specific antigen [PSA]: Secondary | ICD-10-CM | POA: Diagnosis present

## 2022-12-18 MED ORDER — GADOBUTROL 1 MMOL/ML IV SOLN
10.0000 mL | Freq: Once | INTRAVENOUS | Status: AC | PRN
  Administered 2022-12-18: 10 mL via INTRAVENOUS

## 2023-02-26 ENCOUNTER — Ambulatory Visit (INDEPENDENT_AMBULATORY_CARE_PROVIDER_SITE_OTHER): Payer: BC Managed Care – PPO

## 2023-09-17 ENCOUNTER — Encounter (INDEPENDENT_AMBULATORY_CARE_PROVIDER_SITE_OTHER): Payer: Self-pay | Admitting: *Deleted

## 2023-10-05 ENCOUNTER — Telehealth: Payer: Self-pay

## 2023-10-05 NOTE — Telephone Encounter (Signed)
 Ok to schedule.  Room any room   Thanks,  Korine Winton Faizan Royce Stegman, MD Gastroenterology and Hepatology [facility] Gastroenterology

## 2023-10-05 NOTE — Telephone Encounter (Signed)
 Who is your primary care physician: Dr.Terry Sharion Davidson  Reasons for the colonoscopy: screening  Have you had a colonoscopy before?  no  Do you have family history of colon cancer? no  Previous colonoscopy with polyps removed? no  Do you have a history colorectal cancer?   no  Are you diabetic? If yes, Type 1 or Type 2?    no  Do you have a prosthetic or mechanical heart valve? no  Do you have a pacemaker/defibrillator?   no  Have you had endocarditis/atrial fibrillation? no  Have you had joint replacement within the last 12 months?  no  Do you tend to be constipated or have to use laxatives? no  Do you have any history of drugs or alchohol?  no  Do you use supplemental oxygen?  no  Have you had a stroke or heart attack within the last 6 months? no  Do you take weight loss medication?  no       Do you take any blood-thinning medications such as: (aspirin , warfarin, Plavix, Aggrenox)  no  If yes we need the name, milligram, dosage and who is prescribing doctor  Current Outpatient Medications on File Prior to Visit  Medication Sig Dispense Refill   diltiazem  (CARDIZEM  CD) 240 MG 24 hr capsule Take 240 mg by mouth daily.     meloxicam (MOBIC) 15 MG tablet Take 15 mg by mouth daily.     metoprolol  tartrate (LOPRESSOR ) 25 MG tablet Take 25 mg by mouth daily as needed. Heart racing or out of rhythm     No current facility-administered medications on file prior to visit.    No Known Allergies   Pharmacy: [facility]  Primary Insurance Name: [employer]   [id number]  Best number where you can be reached: [phone]

## 2023-10-06 NOTE — Telephone Encounter (Signed)
 Spoke with pt. Scheduled for 7/8 with Dr. Alita Irwin. Aware will send instructions and rx for prep to pharmacy

## 2023-10-07 ENCOUNTER — Encounter (INDEPENDENT_AMBULATORY_CARE_PROVIDER_SITE_OTHER): Payer: Self-pay | Admitting: *Deleted

## 2023-10-07 MED ORDER — PEG 3350-KCL-NA BICARB-NACL 420 G PO SOLR: 4000.0000 mL | Freq: Once | ORAL | 0 refills | Status: AC

## 2023-10-07 NOTE — Addendum Note (Signed)
 Addended by: Feliz Hosteller on: 10/07/2023 07:53 AM   Modules accepted: Orders

## 2023-10-07 NOTE — Telephone Encounter (Signed)
 Referral completed, TCS apt letter sent to PCP

## 2023-11-10 ENCOUNTER — Ambulatory Visit (HOSPITAL_COMMUNITY)
Admission: RE | Admit: 2023-11-10 | Discharge: 2023-11-10 | Disposition: A | Discharge status: Home / Self Care | Attending: Gastroenterology | Admitting: Gastroenterology

## 2023-11-10 ENCOUNTER — Ambulatory Visit (HOSPITAL_COMMUNITY): Admitting: Certified Registered"

## 2023-11-10 ENCOUNTER — Encounter (HOSPITAL_COMMUNITY): Payer: Self-pay | Admitting: Gastroenterology

## 2023-11-10 ENCOUNTER — Encounter (HOSPITAL_COMMUNITY)
Admission: RE | Disposition: A | Discharge status: Home / Self Care | Payer: Self-pay | Source: Home / Self Care | Attending: Gastroenterology

## 2023-11-10 ENCOUNTER — Other Ambulatory Visit: Payer: Self-pay

## 2023-11-10 DIAGNOSIS — I209 Angina pectoris, unspecified: Secondary | ICD-10-CM | POA: Diagnosis not present

## 2023-11-10 DIAGNOSIS — Z79899 Other long term (current) drug therapy: Secondary | ICD-10-CM | POA: Insufficient documentation

## 2023-11-10 DIAGNOSIS — Z1211 Encounter for screening for malignant neoplasm of colon: Secondary | ICD-10-CM | POA: Diagnosis present

## 2023-11-10 DIAGNOSIS — I10 Essential (primary) hypertension: Secondary | ICD-10-CM | POA: Insufficient documentation

## 2023-11-10 DIAGNOSIS — D123 Benign neoplasm of transverse colon: Secondary | ICD-10-CM | POA: Diagnosis not present

## 2023-11-10 DIAGNOSIS — K648 Other hemorrhoids: Secondary | ICD-10-CM | POA: Diagnosis not present

## 2023-11-10 DIAGNOSIS — K644 Residual hemorrhoidal skin tags: Secondary | ICD-10-CM | POA: Insufficient documentation

## 2023-11-10 DIAGNOSIS — K5730 Diverticulosis of large intestine without perforation or abscess without bleeding: Secondary | ICD-10-CM | POA: Insufficient documentation

## 2023-11-10 DIAGNOSIS — Z87891 Personal history of nicotine dependence: Secondary | ICD-10-CM | POA: Diagnosis not present

## 2023-11-10 DIAGNOSIS — Z87442 Personal history of urinary calculi: Secondary | ICD-10-CM | POA: Diagnosis not present

## 2023-11-10 DIAGNOSIS — Z791 Long term (current) use of non-steroidal anti-inflammatories (NSAID): Secondary | ICD-10-CM | POA: Insufficient documentation

## 2023-11-10 HISTORY — PX: COLONOSCOPY: SHX5424

## 2023-11-10 LAB — HM COLONOSCOPY

## 2023-11-10 SURGERY — COLONOSCOPY
Anesthesia: General

## 2023-11-10 MED ORDER — PROPOFOL 10 MG/ML IV BOLUS
INTRAVENOUS | Status: DC | PRN
Start: 2023-11-10 — End: 2023-11-10
  Administered 2023-11-10: 150 ug/kg/min via INTRAVENOUS
  Administered 2023-11-10: 100 mg via INTRAVENOUS

## 2023-11-10 MED ORDER — LACTATED RINGERS IV SOLN: INTRAVENOUS | Status: DC

## 2023-11-10 MED ORDER — LACTATED RINGERS IV SOLN: INTRAVENOUS | Status: DC | PRN

## 2023-11-10 MED ORDER — LIDOCAINE 2% (20 MG/ML) 5 ML SYRINGE
INTRAMUSCULAR | Status: DC | PRN
  Administered 2023-11-10: 60 mg via INTRAVENOUS

## 2023-11-10 NOTE — H&P (Signed)
 Primary Care Physician:  Toribio Jerel MATSU, MD Primary Gastroenterologist:  Dr. Cinderella  Pre-Procedure History & Physical: HPI:  Javier Taylor is a 53 y.o. male is here for a colonoscopy for colon cancer screening purposes.  Patient denies any family history of colorectal cancer.  No melena or hematochezia.  No abdominal pain or unintentional weight loss.  No change in bowel habits.  Overall feels well from a GI standpoint.  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 Authorizing Provider  diltiazem  (CARDIZEM  CD) 240 MG 24 hr capsule Take 240 mg by mouth daily. 09/29/19  Yes [provider]  meloxicam (MOBIC) 15 MG tablet Take 15 mg by mouth daily.   Yes [provider]    Allergies as of 10/07/2023   (No Known Allergies)    History reviewed. No pertinent family history.  Social History   Socioeconomic History   Marital status: Legally Separated    Spouse name: Not on file   Number of children: Not on file   Years of education: Not on file   Highest education level: Not on file  Occupational History   Not on file  Tobacco Use   Smoking status: Former    Current packs/day: 0.00    Average packs/day: 2.0 packs/day for 10.0 years (20.0 ttl pk-yrs)    Types: Cigarettes    Start date: 10/06/1982    Quit date: 10/05/1992     Years since quitting: 31.1   Smokeless tobacco: Never  Vaping Use   Vaping status: Never Used  Substance and Sexual Activity   Alcohol use: Never   Drug use: Never   Sexual activity: Not on file  Other Topics Concern   Not on file  Social History Narrative   Not on file   Social Drivers of Health   Financial Resource Strain: Not on file  Food Insecurity: Not on file  Transportation Needs: Not on file  Physical Activity: Not on file  Stress: Not on file  Social Connections: Not on file  Intimate Partner Violence: Not on file    Review of Systems: See HPI, otherwise negative ROS  Physical Exam: Vital signs in last 24 hours: Temp:  [98.2 F (36.8 C)] 98.2 F (36.8 C) (07/08 0852) Pulse Rate:  [74] 74 (07/08 0852) Resp:  [14] 14 (07/08 0852) BP: (161)/(104) 161/104 (07/08 0852) SpO2:  [97 %] 97 % (07/08 0852) Weight:  [97.5 kg] 97.5 kg (07/08 0852)   General:   Alert,  Well-developed, well-nourished, pleasant and cooperative in NAD Head:  Normocephalic and atraumatic. Eyes:  Sclera clear, no icterus.   Conjunctiva pink. Ears:  Normal auditory acuity. Nose:  No deformity, discharge,  or lesions. Msk:  Symmetrical without gross deformities. Normal posture. Extremities:  Without clubbing or edema. Neurologic:  Alert and  oriented x4;  grossly normal neurologically. Skin:  Intact without significant lesions or rashes. Psych:  Alert and cooperative. Normal mood and affect.  Impression/Plan: Javier Taylor is here for a colonoscopy to be performed for colon cancer screening purposes.  The risks of the procedure including infection, bleed, or perforation as well as benefits, limitations, alternatives and imponderables have been reviewed with the patient. Questions have been answered. All parties agreeable.

## 2023-11-10 NOTE — Transfer of Care (Signed)
 Immediate Anesthesia Transfer of Care Note  Patient: Javier Taylor  Procedure(s) Performed: COLONOSCOPY  Patient Location: Endoscopy Unit  Anesthesia Type:General  Level of Consciousness: drowsy and patient cooperative  Airway & Oxygen Therapy: Patient Spontanous Breathing  Post-op Assessment: Report given to RN and Post -op Vital signs reviewed and stable  Post vital signs: Reviewed and stable  Last Vitals:  Vitals Value Taken Time  BP 128/93 11/10/23 09:59  Temp 36.5 C 11/10/23 09:59  Pulse 88 11/10/23 09:59  Resp 11 11/10/23 09:59  SpO2 96 % 11/10/23 09:59    Last Pain:  Vitals:   11/10/23 1004  TempSrc:   PainSc: 0-No pain      Patients Stated Pain Goal: 6 (11/10/23 0852)  Complications: No notable events documented.

## 2023-11-10 NOTE — Anesthesia Preprocedure Evaluation (Signed)
 Anesthesia Evaluation  Patient identified by MRN, date of birth, ID band Patient awake    Reviewed: Allergy & Precautions, H&P , NPO status , Patient's Chart, lab work & pertinent test results, reviewed documented beta blocker date and time   Airway Mallampati: II  TM Distance: >3 FB Neck ROM: full    Dental no notable dental hx.    Pulmonary neg pulmonary ROS, former smoker   Pulmonary exam normal breath sounds clear to auscultation       Cardiovascular Exercise Tolerance: Good hypertension, + angina  negative cardio ROS + dysrhythmias  Rhythm:regular Rate:Normal     Neuro/Psych negative neurological ROS  negative psych ROS   GI/Hepatic negative GI ROS, Neg liver ROS,,,  Endo/Other  negative endocrine ROS    Renal/GU negative Renal ROS  negative genitourinary   Musculoskeletal   Abdominal   Peds  Hematology negative hematology ROS (+)   Anesthesia Other Findings   Reproductive/Obstetrics negative OB ROS                              Anesthesia Physical Anesthesia Plan  ASA: 2  Anesthesia Plan: General   Post-op Pain Management:    Induction:   PONV Risk Score and Plan: Propofol  infusion  Airway Management Planned:   Additional Equipment:   Intra-op Plan:   Post-operative Plan:   Informed Consent: I have reviewed the patients History and Physical, chart, labs and discussed the procedure including the risks, benefits and alternatives for the proposed anesthesia with the patient or authorized representative who has indicated his/her understanding and acceptance.     Dental Advisory Given  Plan Discussed with: CRNA  Anesthesia Plan Comments:         Anesthesia Quick Evaluation

## 2023-11-10 NOTE — Discharge Instructions (Signed)

## 2023-11-10 NOTE — Op Note (Signed)
 [facility] Patient Name: Javier Taylor Procedure Date: 11/10/2023 9:07 AM MRN: [id number] Date of Birth: 1970/12/16 Attending MD: Deatrice Dine , MD, [id number] CSN: [id number] Age: 53 Admit Type: Outpatient Procedure:                Colonoscopy Indications:              Screening for colorectal malignant neoplasm Providers:                Deatrice Dine, MD, Ashley Goins, Tammy Vaught, RN,                            Kristine L. Shirlean Balm, Technician Referring MD:              Medicines:                Monitored Anesthesia Care Complications:            No immediate complications. Estimated Blood Loss:     Estimated blood loss: none. Procedure:                Pre-Anesthesia Assessment:                           - Prior to the procedure, a History and Physical                            was performed, and patient medications and                            allergies were reviewed. The patient's tolerance of                            previous anesthesia was also reviewed. The risks                            and benefits of the procedure and the sedation                            options and risks were discussed with the patient.                            All questions were answered, and informed consent                            was obtained. Prior Anticoagulants: The patient has                            taken no anticoagulant or antiplatelet agents. ASA                            Grade Assessment: II - A patient with mild systemic                            disease. After reviewing the risks and benefits,  the patient was deemed in satisfactory condition to                            undergo the procedure.                           After obtaining informed consent, the colonoscope                            was passed under direct vision. Throughout the                            procedure, the patient's blood pressure, pulse, and                             oxygen saturations were monitored continuously. The                            [id number]) scope was introduced through the                            anus and advanced to the the cecum, identified by                            appendiceal orifice and ileocecal valve. The                            colonoscopy was somewhat difficult due to                            significant looping. Successful completion of the                            procedure was aided by applying abdominal pressure.                            The patient tolerated the procedure well. The                            quality of the bowel preparation was evaluated                            using the BBPS [facility] Bowel Preparation Scale)                            with scores of: Right Colon = 3, Transverse Colon =                            3 and Left Colon = 3 (entire mucosa seen well with                            no residual staining, small fragments of stool or  opaque liquid). The total BBPS score equals 9. The                            ileocecal valve, appendiceal orifice, and rectum                            were photographed. Scope In: 9:20:15 AM Scope Out: 9:56:07 AM Scope Withdrawal Time: 0 hours 31 minutes 42 seconds  Total Procedure Duration: 0 hours 35 minutes 52 seconds  Findings:      A 5 mm polyp was found in the hepatic flexure. The polyp was sessile.       The polyp was removed with a cold snare. Resection and retrieval were       complete.      An 8 mm polyp was found in the transverse colon. The polyp was sessile.       The polyp was removed with a cold snare. Resection and retrieval were       complete.      Scattered medium-mouthed diverticula were found in the left colon.      Non-bleeding external and internal hemorrhoids were found during       retroflexion. The hemorrhoids were medium-sized. Impression:               - One 5 mm polyp at the  hepatic flexure, removed                            with a cold snare. Resected and retrieved.                           - One 8 mm polyp in the transverse colon, removed                            with a cold snare. Resected and retrieved.                           - Diverticulosis in the left colon.                           - Non-bleeding external and internal hemorrhoids. Moderate Sedation:      Per Anesthesia Care Recommendation:           - Patient has a contact number available for                            emergencies. The signs and symptoms of potential                            delayed complications were discussed with the                            patient. Return to normal activities tomorrow.                            Written discharge instructions were provided to the  patient.                           - Resume previous diet.                           - Continue present medications.                           - Await pathology results.                           - Repeat colonoscopy in 7-10 years for surveillance                            based on pathology results.                           - Return to primary care physician as previously                            scheduled. Procedure Code(s):        --- Professional ---                           [phone], Colonoscopy, flexible; with removal of                            tumor(s), polyp(s), or other lesion(s) by snare                            technique Diagnosis Code(s):        --- Professional ---                           Z12.11, Encounter for screening for malignant                            neoplasm of colon                           D12.3, Benign neoplasm of transverse colon (hepatic                            flexure or splenic flexure)                           K64.8, Other hemorrhoids                           K57.30, Diverticulosis of large intestine without                             perforation or abscess without bleeding CPT copyright 2022 American Medical Association. All rights reserved. The codes documented in this report are preliminary and upon coder review may  be revised to meet current compliance requirements. Deatrice Dine, MD Deatrice Dine, MD 11/10/2023 10:01:53 AM This report has been signed  electronically. Number of Addenda: 0

## 2023-11-11 ENCOUNTER — Encounter (INDEPENDENT_AMBULATORY_CARE_PROVIDER_SITE_OTHER): Payer: Self-pay | Admitting: *Deleted

## 2023-11-11 ENCOUNTER — Encounter (HOSPITAL_COMMUNITY): Payer: Self-pay | Admitting: Gastroenterology

## 2023-11-11 LAB — SURGICAL PATHOLOGY

## 2023-11-11 NOTE — Anesthesia Postprocedure Evaluation (Signed)
 Anesthesia Post Note  Patient: Javier Taylor  Procedure(s) Performed: COLONOSCOPY  Patient location during evaluation: Phase II Anesthesia Type: General Level of consciousness: awake Pain management: pain level controlled Vital Signs Assessment: post-procedure vital signs reviewed and stable Respiratory status: spontaneous breathing and respiratory function stable Cardiovascular status: blood pressure returned to baseline and stable Postop Assessment: no headache and no apparent nausea or vomiting Anesthetic complications: no Comments: Late entry   No notable events documented.   Last Vitals:  Vitals:   11/10/23 0852 11/10/23 0959  BP: (!) 161/104 (!) 128/93  Pulse: 74 88  Resp: 14 11  Temp: 36.8 C 36.5 C  SpO2: 97% 96%    Last Pain:  Vitals:   11/10/23 1004  TempSrc:   PainSc: 0-No pain                 Yvonna JINNY Bosworth

## 2023-11-12 ENCOUNTER — Ambulatory Visit (INDEPENDENT_AMBULATORY_CARE_PROVIDER_SITE_OTHER): Payer: Self-pay | Admitting: Gastroenterology

## 2023-11-17 NOTE — Progress Notes (Signed)
 7 yr TCS noted in recall Patient result letter mailed procedure note and pathology result faxed to PCP
